# Patient Record
Sex: Female | Born: 1975 | Race: Black or African American | Hispanic: No | Marital: Married | Smoking: Current every day smoker
Health system: Southern US, Community
[De-identification: ages and names within clinical notes are randomized; demographics above are authoritative.]

## PROBLEM LIST (undated history)

## (undated) DIAGNOSIS — K219 Gastro-esophageal reflux disease without esophagitis: Secondary | ICD-10-CM

## (undated) DIAGNOSIS — E049 Nontoxic goiter, unspecified: Secondary | ICD-10-CM

## (undated) DIAGNOSIS — E119 Type 2 diabetes mellitus without complications: Secondary | ICD-10-CM

## (undated) DIAGNOSIS — D649 Anemia, unspecified: Secondary | ICD-10-CM

## (undated) DIAGNOSIS — E282 Polycystic ovarian syndrome: Secondary | ICD-10-CM

## (undated) HISTORY — DX: Anemia, unspecified: D64.9

## (undated) HISTORY — PX: CHOLECYSTECTOMY: SHX55

## (undated) HISTORY — DX: Polycystic ovarian syndrome: E28.2

## (undated) HISTORY — DX: Type 2 diabetes mellitus without complications: E11.9

## (undated) HISTORY — DX: Gastro-esophageal reflux disease without esophagitis: K21.9

---
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
## 2010-07-24 DIAGNOSIS — E282 Polycystic ovarian syndrome: Secondary | ICD-10-CM | POA: Insufficient documentation

## 2017-05-08 ENCOUNTER — Ambulatory Visit: Payer: 59 | Admitting: Medical

## 2017-05-08 ENCOUNTER — Encounter: Payer: Self-pay | Admitting: Medical

## 2017-05-08 ENCOUNTER — Telehealth: Payer: Self-pay

## 2017-05-08 VITALS — BP 110/70 | HR 86 | Temp 98.3°F | Resp 16 | Ht 65.0 in | Wt 307.2 lb

## 2017-05-08 DIAGNOSIS — E282 Polycystic ovarian syndrome: Secondary | ICD-10-CM | POA: Diagnosis not present

## 2017-05-08 DIAGNOSIS — Z862 Personal history of diseases of the blood and blood-forming organs and certain disorders involving the immune mechanism: Secondary | ICD-10-CM | POA: Diagnosis not present

## 2017-05-08 DIAGNOSIS — E118 Type 2 diabetes mellitus with unspecified complications: Secondary | ICD-10-CM | POA: Diagnosis not present

## 2017-05-08 DIAGNOSIS — N921 Excessive and frequent menstruation with irregular cycle: Secondary | ICD-10-CM

## 2017-05-08 LAB — CBC WITH DIFFERENTIAL/PLATELET
Basophils Absolute: 0 10*3/uL (ref 0.0–0.1)
Basophils Relative: 0.8 % (ref 0.0–3.0)
Eosinophils Absolute: 0.1 10*3/uL (ref 0.0–0.7)
Eosinophils Relative: 0.9 % (ref 0.0–5.0)
HCT: 35.5 % — ABNORMAL LOW (ref 36.0–46.0)
Hemoglobin: 11.5 g/dL — ABNORMAL LOW (ref 12.0–15.0)
Lymphocytes Relative: 39.6 % (ref 12.0–46.0)
Lymphs Abs: 2.5 10*3/uL (ref 0.7–4.0)
MCHC: 32.3 g/dL (ref 30.0–36.0)
MCV: 86.6 fl (ref 78.0–100.0)
Monocytes Absolute: 0.5 10*3/uL (ref 0.1–1.0)
Monocytes Relative: 8.6 % (ref 3.0–12.0)
Neutro Abs: 3.1 10*3/uL (ref 1.4–7.7)
Neutrophils Relative %: 50.1 % (ref 43.0–77.0)
Platelets: 244 10*3/uL (ref 150.0–400.0)
RBC: 4.1 Mil/uL (ref 3.87–5.11)
RDW: 15 % (ref 11.5–15.5)
WBC: 6.2 10*3/uL (ref 4.0–10.5)

## 2017-05-08 LAB — HEMOGLOBIN A1C: Hgb A1c MFr Bld: 7.9 % — ABNORMAL HIGH (ref 4.6–6.5)

## 2017-05-08 LAB — COMPREHENSIVE METABOLIC PANEL
ALT: 12 U/L (ref 0–35)
AST: 14 U/L (ref 0–37)
Albumin: 3.7 g/dL (ref 3.5–5.2)
Alkaline Phosphatase: 75 U/L (ref 39–117)
BUN: 11 mg/dL (ref 6–23)
CO2: 29 mEq/L (ref 19–32)
Calcium: 9.2 mg/dL (ref 8.4–10.5)
Chloride: 103 mEq/L (ref 96–112)
Creatinine, Ser: 0.69 mg/dL (ref 0.40–1.20)
GFR: 120.22 mL/min (ref >60.00)
Glucose, Bld: 153 mg/dL — ABNORMAL HIGH (ref 70–99)
Potassium: 4 mEq/L (ref 3.5–5.1)
Sodium: 137 mEq/L (ref 135–145)
Total Bilirubin: 0.5 mg/dL (ref 0.2–1.2)
Total Protein: 7.6 g/dL (ref 6.0–8.3)

## 2017-05-08 MED ORDER — METFORMIN HCL 500 MG PO TABS: 500.0000 mg | ORAL_TABLET | Freq: Two times a day (BID) | ORAL | 3 refills | Status: DC

## 2017-05-08 MED ORDER — LIRAGLUTIDE 18 MG/3ML ~~LOC~~ SOPN: 1.2000 mg | PEN_INJECTOR | Freq: Once | SUBCUTANEOUS | 1 refills | Status: DC

## 2017-05-08 MED ORDER — PHENTERMINE HCL 37.5 MG PO CAPS: 37.5000 mg | ORAL_CAPSULE | ORAL | 0 refills | Status: DC

## 2017-05-08 MED FILL — PHENTERMINE 37.5 MG CAPSULE: 37.5 | 30 days supply | Qty: 30 | Fill #0

## 2017-05-08 MED FILL — metFORMIN HCL 500 MG TABS: 500 | 90 days supply | Qty: 180 | Fill #0

## 2017-05-08 NOTE — Patient Instructions (Addendum)
For diabetes, I am refilling your former victoza prescription and metformin.  Encouraged healthy/low sugar diet and daily exercise.  I am going to refer you to endocrinologist as you had one before and also have PCOS.  For history of obesity, continue with daily exercise and diet.  You have been on phentermine in the past but had to stop when he lost insurance.  I am going to prescribe phentermine.  Printed prescription given today.  Need to follow-up in 1 month to recheck your weight.  Make sure no adverse side effects.  I want you to check your blood pressure and pulse at least every other day.  Need to make sure that your blood pressure does not increase over 140/90 or your pulse over 100 consistently.  Note after 2-3 months of phentermine I think it would be best to refer to weight loss specialist you desire more weight loss.  Please get CBC, CMP and A1c today.  I did go ahead and place referral to gynecologist to discuss possibility of hysterectomy in light of your story of severe heavy menses with anemia.   Follow-up in 1 month or as needed.

## 2017-05-08 NOTE — Progress Notes (Signed)
Subjective:    Patient ID: Katie Luna, female    DOB: 01/29/1976, 42 y.o.   MRN: [id number]  HPI  Pt in for first time.  Pt is from [place]. She moved here after husband job transfer. 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 Pt has been exercising. Diet has been moderate healthy.   Pt in May was trying to loose weight. She was exercising, went to nutritionist and was on phentermine. Then lost insurance.  Patient states  normal thyroid studies in the past.  Pt diabetic. Has not been on medication since august. Pt last a1-c while on medication was 7.  Today fingerstick sugar was 165 non fasting. Pt was last on victoza 1.2 mg and metformin. Pt had one day was 487 about 2 weeks ago. Since then she has been checking at work and levels have run high 100's- high 200's.  Pt also has history of gerd.  Pt is on pantoprazole. This does help.   LMP- Pt has hx of polycystic ovarian syndrome. No fibroids. Pt did have [place] that showed cyst on ovaries.   History of anemia related to heavy menses in the past.One time in past had 93 days bleeding.      Review of Systems  Constitutional: Negative for chills, fatigue and fever.  HENT: Negative for dental problem, ear discharge and ear pain.   Respiratory: Negative for cough, chest tightness, shortness of breath and wheezing.   Cardiovascular: Negative for chest pain and palpitations.  Gastrointestinal: Negative for abdominal distention, abdominal pain, constipation, diarrhea, nausea and vomiting.  Endocrine: Negative for polydipsia, polyphagia and polyuria.  Musculoskeletal: Negative for back pain.  Skin: Negative for rash.  Neurological: Negative for dizziness, seizures, speech difficulty, weakness and headaches.  Hematological: Negative for adenopathy. Does not bruise/bleed easily.  Psychiatric/Behavioral: Negative for behavioral problems, confusion, dysphoric mood and suicidal ideas. The patient is not nervous/anxious.       Some moderate stress.    Past Medical History:  Diagnosis Date  . Anemia   . Diabetes mellitus without complication (HCC)   . GERD (gastroesophageal reflux disease)   . Polycystic ovary syndrome      Social History   Socioeconomic History  . Marital status: Married    Spouse name: Not on file  . Number of children: 1  . Years of education: LPN  . Highest education level: Not on file  Social Needs  . Financial resource strain: Not on file  . Food insecurity - worry: Not on file  . Food insecurity - inability: Not on file  . Transportation needs - medical: Not on file  . Transportation needs - non-medical: Not on file  Occupational History  . Not on file  Tobacco Use  . Smoking status: Never Smoker  . Smokeless tobacco: Never Used  Substance and Sexual Activity  . Alcohol use: Yes    Comment: very rare infrequent. occaisional glass of wine.  . Drug use: No  . Sexual activity: Yes  Other Topics Concern  . Not on file  Social History Narrative  . Not on file    Past Surgical History:  Procedure Laterality Date  . CHOLECYSTECTOMY      Family History  Problem Relation Age of Onset  . Diabetes Mother   . Cancer Mother   . Sickle cell anemia Father   . Alcohol abuse Father   . Colon cancer Father   . Diabetes Maternal Grandfather     Allergies not on file  Current Outpatient Medications on File Prior to Visit  Medication Sig Dispense Refill  . liraglutide (VICTOZA) 18 MG/3ML SOPN Inject into the skin.    . metFORMIN (GLUCOPHAGE) 500 MG tablet take 1 tablet by mouth twice a day    . pantoprazole (PROTONIX) 40 MG tablet take 1 tablet by mouth once daily     No current facility-administered medications on file prior to visit.     BP 133/88   Pulse 86   Temp 98.3 F (36.8 C) (Oral)   Resp 16   Ht 5\' 5"  (1.651 m)   Wt (!) 307 lb 3.2 oz (139.3 kg)   SpO2 100%   BMI 51.12 kg/m       Objective:   Physical Exam  General Mental Status- Alert.  General Appearance- Not in acute distress.  Obese body habitus.  Skin General: Color- Normal Color. Moisture- Normal Moisture.  Neck Carotid Arteries- Normal color. Moisture- Normal Moisture. No carotid bruits. No JVD.  Chest and Lung Exam Auscultation: Breath Sounds:-Normal.  Cardiovascular Auscultation:Rythm- Regular. Murmurs & Other Heart Sounds:Auscultation of the heart reveals- No Murmurs.  Neurologic Cranial Nerve exam:- CN III-XII intact(No nystagmus), symmetric smile. Strength:- 5/5 equal and symmetric strength both upper and lower extremities.  Abdomen-soft, nontender, obese protuberant.  Back- no CVA tenderness.      Assessment & Plan:  For diabetes, I am refilling your former victoza prescription and metformin.  Encouraged healthy/low sugar diet and daily exercise.  I am going to refer you to endocrinologist as you had one before and also have PCOS.  For history of obesity, continue with daily exercise and diet.  You have been on phentermine in the past but had to stop when he lost insurance.  I am going to prescribe phentermine.  Printed prescription given today.  Need to follow-up in 1 month to recheck your weight.  Make sure no adverse side effects.  I want you to check your blood pressure and pulse at least every other day.  Need to make sure that you may not increase in your blood pressure over 140/90 or your pulse over 100 consistently.  Note after 2-3 months of phentermine I think it would be best to refer to weight loss specialist you desire more weight loss.  Please get CBC, CMP and A1c today.  I did go ahead and place referral to gynecologist to discuss possibility of hysterectomy in light of your story of severe heavy menses with anemia.   Follow-up in 1 month or as needed.  Buffy Ehler, Ramon DredgeEdward, PA-C

## 2017-05-08 NOTE — Telephone Encounter (Signed)
PA initiated via Covermymeds; KEY: [id number]. Awaiting determination.

## 2017-05-13 ENCOUNTER — Encounter: Payer: Self-pay | Admitting: Medical

## 2017-05-13 NOTE — Telephone Encounter (Signed)
PA denied. Preferred alternatives are Bydureon Bcise, Byetta, or Trulicity.

## 2017-05-14 ENCOUNTER — Telehealth: Payer: Self-pay | Admitting: Medical

## 2017-05-14 MED ORDER — EXENATIDE ER 2 MG ~~LOC~~ PEN: 2.0000 mg | PEN_INJECTOR | SUBCUTANEOUS | 2 refills | Status: DC

## 2017-05-14 NOTE — Telephone Encounter (Signed)
Would you call patient and notify her that victoza was not covered under insurance and prior authorization failed.  So I called in new medication which is once weekly injection.  It is similar to the victoza.  More convenient and that it is a once weekly injection.  However it may take numerous weeks to see full effect of the medication.

## 2017-05-15 MED FILL — BYDUREON 2 MG PEN INJECT: 2 | 28 days supply | Qty: 4 | Fill #0

## 2017-05-15 NOTE — Telephone Encounter (Signed)
Pt.notified

## 2017-05-15 NOTE — Telephone Encounter (Signed)
Left pt a message to call back. 

## 2017-05-18 DIAGNOSIS — F3132 Bipolar disorder, current episode depressed, moderate: Secondary | ICD-10-CM | POA: Diagnosis not present

## 2017-05-18 MED FILL — lamoTRIgine 25 MG TABS: 25 | 30 days supply | Qty: 90 | Fill #0

## 2017-05-19 NOTE — Telephone Encounter (Signed)
Med changed to Bydureon.

## 2017-05-28 ENCOUNTER — Ambulatory Visit: Payer: 59 | Admitting: Family Medicine

## 2017-05-28 ENCOUNTER — Encounter: Payer: 59 | Admitting: Family Medicine

## 2017-06-10 ENCOUNTER — Ambulatory Visit: Payer: 59 | Admitting: Medical

## 2017-06-15 ENCOUNTER — Encounter: Payer: Self-pay | Admitting: Medical

## 2017-06-15 ENCOUNTER — Ambulatory Visit: Payer: 59 | Admitting: Medical

## 2017-06-15 VITALS — BP 108/66 | HR 85 | Temp 97.9°F | Resp 16 | Ht 65.0 in | Wt 299.4 lb

## 2017-06-15 DIAGNOSIS — E119 Type 2 diabetes mellitus without complications: Secondary | ICD-10-CM | POA: Diagnosis not present

## 2017-06-15 MED ORDER — PHENTERMINE HCL 37.5 MG PO CAPS
37.5000 mg | ORAL_CAPSULE | ORAL | 0 refills | Status: DC
Start: 2017-06-15 — End: 2017-08-14

## 2017-06-15 MED FILL — PHENTERMINE 37.5 MG CAPSULE: 37.5 | 30 days supply | Qty: 30 | Fill #0

## 2017-06-15 MED FILL — lamoTRIgine 100 MG TABS: 100 | 30 days supply | Qty: 45 | Fill #0

## 2017-06-15 NOTE — Progress Notes (Signed)
Subjective:    Patient ID: Katie Luna, female    DOB: 16-Sep-1975, 42 y.o.   MRN: [id number]  HPI  Pt is exercising 3-4 days a week, improved diet and has been taking her medications.  Pt is on metformin, and exanatide.   She states her fasting sugars have been 102-190. Levels average usually around 140. Better with bydureon.  Pt psychiatrist wrote her lamictal. She states ate more and gained some weight.  Specialist wanted her to stay of phentermine initially while on lamictal initially. Then after 3 weeks he stated she could go back on.   Review of Systems  Constitutional: Negative for chills, fatigue and fever.  Respiratory: Negative for cough, chest tightness, shortness of breath and wheezing.   Cardiovascular: Negative for chest pain and palpitations.  Gastrointestinal: Negative for abdominal distention, abdominal pain, nausea and vomiting.  Musculoskeletal: Negative for back pain, gait problem, joint swelling, myalgias and neck stiffness.  Skin: Negative for rash.  Hematological: Negative for adenopathy. Does not bruise/bleed easily.  Psychiatric/Behavioral: Negative for behavioral problems, confusion, dysphoric mood and hallucinations.    Past Medical History:  Diagnosis Date  . Anemia   . Diabetes mellitus without complication (HCC)   . GERD (gastroesophageal reflux disease)   . Polycystic ovary syndrome      Social History   Socioeconomic History  . Marital status: Married    Spouse name: Not on file  . Number of children: 1  . Years of education: LPN  . Highest education level: Not on file  Social Needs  . Financial resource strain: Not on file  . Food insecurity - worry: Not on file  . Food insecurity - inability: Not on file  . Transportation needs - medical: Not on file  . Transportation needs - non-medical: Not on file  Occupational History  . Not on file  Tobacco Use  . Smoking status: Never Smoker  . Smokeless tobacco: Never Used  Substance  and Sexual Activity  . Alcohol use: Yes    Comment: very rare infrequent. occaisional glass of wine.  . Drug use: No  . Sexual activity: Yes  Other Topics Concern  . Not on file  Social History Narrative  . Not on file    Past Surgical History:  Procedure Laterality Date  . CHOLECYSTECTOMY      Family History  Problem Relation Age of Onset  . Diabetes Mother   . Cancer Mother   . Sickle cell anemia Father   . Alcohol abuse Father   . Colon cancer Father   . Diabetes Maternal Grandfather     Allergies  Allergen Reactions  . Other Anaphylaxis  . Shellfish Allergy Swelling    Current Outpatient Medications on File Prior to Visit  Medication Sig Dispense Refill  . Exenatide ER 2 MG PEN Inject 2 mg into the skin every 7 (seven) days. 4 each 2  . lamoTRIgine (LAMICTAL) 25 MG tablet   2  . metFORMIN (GLUCOPHAGE) 500 MG tablet take 1 tablet by mouth twice a day    . metFORMIN (GLUCOPHAGE) 500 MG tablet Take 1 tablet (500 mg total) by mouth 2 (two) times daily with a meal. 180 tablet 3  . pantoprazole (PROTONIX) 40 MG tablet take 1 tablet by mouth once daily    . phentermine 37.5 MG capsule Take 1 capsule (37.5 mg total) by mouth every morning. 30 capsule 0   No current facility-administered medications on file prior to visit.     BP 108/66  Pulse 85   Temp 97.9 F (36.6 C) (Oral)   Resp 16   Ht 5\' 5"  (1.651 m)   Wt 299 lb 6.4 oz (135.8 kg)   SpO2 99%   BMI 49.82 kg/m       Objective:   Physical Exam  General Mental Status- Alert. General Appearance- Not in acute distress.   Skin General: Color- Normal Color. Moisture- Normal Moisture.  Neck Carotid Arteries- Normal color. Moisture- Normal Moisture. No carotid bruits. No JVD.  Chest and Lung Exam Auscultation: Breath Sounds:-Normal.  Cardiovascular Auscultation:Rythm- Regular. Murmurs & Other Heart Sounds:Auscultation of the heart reveals- No Murmurs.  Abdomen Inspection:-Inspeection  Normal. Palpation/Percussion:Note:No mass. Palpation and Percussion of the abdomen reveal- Non Tender, Non Distended + BS, no rebound or guarding.    Neurologic Cranial Nerve exam:- CN III-XII intact(No nystagmus), symmetric smile. Drift Test:- No drift. Romberg Exam:- Negative.  Heal to Toe Gait exam:-Normal. Finger to Nose:- Normal/Intact Strength:- 5/5 equal and symmetric strength both upper and lower extremities.      Assessment & Plan:  Your diabetics appears to be better controlled by at home sugar level. Continue current regimen of meds. Will put future a1c, cmp and urine micro albumin urine.  For obesity continue exercise and diet. Can continue phentermine.  For bipolar mood continue lamictal and specialist wrote you.  Follow up end of April or as needed.

## 2017-06-15 NOTE — Patient Instructions (Addendum)
Your diabetics appears to be better controlled by at home sugar level. Continue current regimen of meds. Will put future a1c, cmp and urine micro albumin urine.  For obesity continue exercise and diet. Can continue phentermine.  For bipolar mood continue lamictal and specialist wrote you.  Follow up end of April or as needed.

## 2017-06-16 ENCOUNTER — Encounter: Payer: Self-pay | Admitting: Internal Medicine

## 2017-06-30 DIAGNOSIS — F3132 Bipolar disorder, current episode depressed, moderate: Secondary | ICD-10-CM | POA: Diagnosis not present

## 2017-07-10 ENCOUNTER — Encounter: Payer: Self-pay | Admitting: Medical

## 2017-07-11 ENCOUNTER — Telehealth: Payer: Self-pay | Admitting: Medical

## 2017-07-11 MED ORDER — EXENATIDE ER 2 MG ~~LOC~~ PEN: 2.0000 mg | PEN_INJECTOR | SUBCUTANEOUS | 2 refills | Status: DC

## 2017-07-11 MED ORDER — VARENICLINE TARTRATE 0.5 MG X 11 & 1 MG X 42 PO MISC: ORAL | 0 refills | Status: DC

## 2017-07-11 NOTE — Telephone Encounter (Signed)
I printed rx of chantix. The starter pack RX too long for it to be electronically sent. Will you [facility]. Or you can have her pick up prescription up front.

## 2017-07-13 MED FILL — BYDUREON 2 MG PEN INJECT: 2 | 28 days supply | Qty: 4 | Fill #0

## 2017-07-13 MED FILL — CHANTIX STARTING MONTH BOX: 0.5 MG X 11 | 28 days supply | Qty: 53 | Fill #0

## 2017-07-13 NOTE — Telephone Encounter (Signed)
Sent rx to pharmacy

## 2017-07-28 MED FILL — lamoTRIgine 25 MG TABS: 25 | 30 days supply | Qty: 90 | Fill #1

## 2017-07-28 MED FILL — lamoTRIgine 100 MG TABS: 100 | 30 days supply | Qty: 45 | Fill #1

## 2017-08-05 DIAGNOSIS — F3132 Bipolar disorder, current episode depressed, moderate: Secondary | ICD-10-CM | POA: Diagnosis not present

## 2017-08-10 DIAGNOSIS — J209 Acute bronchitis, unspecified: Secondary | ICD-10-CM | POA: Diagnosis not present

## 2017-08-10 DIAGNOSIS — R0989 Other specified symptoms and signs involving the circulatory and respiratory systems: Secondary | ICD-10-CM | POA: Diagnosis not present

## 2017-08-14 ENCOUNTER — Ambulatory Visit (HOSPITAL_BASED_OUTPATIENT_CLINIC_OR_DEPARTMENT_OTHER)
Admission: RE | Admit: 2017-08-14 | Discharge: 2017-08-14 | Disposition: A | Discharge status: Home / Self Care | Payer: 59 | Source: Ambulatory Visit | Attending: Medical | Admitting: Medical

## 2017-08-14 ENCOUNTER — Encounter: Payer: Self-pay | Admitting: Medical

## 2017-08-14 ENCOUNTER — Ambulatory Visit: Payer: 59 | Admitting: Medical

## 2017-08-14 VITALS — BP 125/78 | HR 88 | Temp 98.2°F | Resp 16 | Ht 65.0 in | Wt 298.0 lb

## 2017-08-14 DIAGNOSIS — R059 Cough, unspecified: Secondary | ICD-10-CM

## 2017-08-14 DIAGNOSIS — R05 Cough: Secondary | ICD-10-CM | POA: Diagnosis not present

## 2017-08-14 DIAGNOSIS — J301 Allergic rhinitis due to pollen: Secondary | ICD-10-CM | POA: Diagnosis not present

## 2017-08-14 DIAGNOSIS — R062 Wheezing: Secondary | ICD-10-CM

## 2017-08-14 DIAGNOSIS — Z79899 Other long term (current) drug therapy: Secondary | ICD-10-CM | POA: Diagnosis not present

## 2017-08-14 DIAGNOSIS — F17200 Nicotine dependence, unspecified, uncomplicated: Secondary | ICD-10-CM

## 2017-08-14 MED ORDER — FLUTICASONE PROPIONATE 50 MCG/ACT NA SUSP: 2.0000 | Freq: Every day | NASAL | 1 refills | Status: AC

## 2017-08-14 MED ORDER — BECLOMETHASONE DIPROP HFA 40 MCG/ACT IN AERB: 2.0000 | INHALATION_SPRAY | Freq: Two times a day (BID) | RESPIRATORY_TRACT | 2 refills | Status: DC

## 2017-08-14 MED ORDER — BENZONATATE 100 MG PO CAPS: 100.0000 mg | ORAL_CAPSULE | Freq: Three times a day (TID) | ORAL | 0 refills | Status: DC | PRN

## 2017-08-14 MED ORDER — LEVOCETIRIZINE DIHYDROCHLORIDE 5 MG PO TABS
5.0000 mg | ORAL_TABLET | Freq: Every evening | ORAL | 0 refills | Status: DC
Start: 2017-08-14 — End: 2020-01-09

## 2017-08-14 MED ORDER — PHENTERMINE HCL 37.5 MG PO CAPS: 37.5000 mg | ORAL_CAPSULE | ORAL | 0 refills | Status: DC

## 2017-08-14 MED ORDER — BUPROPION HCL ER (XL) 150 MG PO TB24: 150.0000 mg | ORAL_TABLET | Freq: Every day | ORAL | 1 refills | Status: DC

## 2017-08-14 MED FILL — LEVOCETIRIZINE 5 MG TABLET: 5 | 30 days supply | Qty: 30 | Fill #0

## 2017-08-14 MED FILL — BENZONATATE 100 MG CAP: 100 | 10 days supply | Qty: 30 | Fill #0

## 2017-08-14 MED FILL — buPROPion HCL ER (XL) 150 M: 150 | 30 days supply | Qty: 30 | Fill #0

## 2017-08-14 MED FILL — FLUTICASONE PROP 50 MCG SPR: 50 | 30 days supply | Qty: 16 | Fill #0

## 2017-08-14 MED FILL — PHENTERMINE 37.5 MG CAPSULE: 37.5 | 30 days supply | Qty: 30 | Fill #0

## 2017-08-14 MED FILL — QVAR REDIHALER 40 MCG/ACT A: 40 | 30 days supply | Qty: 11 | Fill #0

## 2017-08-14 NOTE — Patient Instructions (Addendum)
For your cough and history of recent bronchitis, I am refilling your benzonatate and getting chest x-ray.  You did finish a course of doxycycline but I want to verify that chest x-ray is clear as you are cough is now approaching 3 weeks.  Recent allergic rhinitis type signs and symptoms as well.  I prescribed Xyzal and Flonase.    For intermittent wheezing, I prescribed Qvar and you have Ventolin inhaler to use if needed.  For smoking cessation, I will prescribe Wellbutrin.  Start today and in 2 weeks start to taper off smoking.  Chantix discontinued since he lost the prescription and I think insurance probably would not refill it today.  You have lost about 10 pounds since January.  I am going to refill the phentermine but also get you sign a controlled medication contract and give a UDS.  Plan to refill phentermine for the month of June as well.  Then July consider other medication options.  Follow-up in 7-10 days or as needed

## 2017-08-14 NOTE — Progress Notes (Signed)
Subjective:    Patient ID: Katie Luna, female    DOB: [birth year], 42 y.o.   MRN: [id number]  HPI  Pt in for follow up.  Pt states 2 weeks ago she was having severe coughing event. Pt went to [facility] in [place]. She was given doxycycline, ventolin and benzonatate. She states cough was dry with sob at that time. Pt states she feels a lot better now. But still residual cough.  Pt has some pnd, itchy eyes and runny nose as well over past 2 weeks. Pt still has cough but as severe as before. Keeping hr up at night.  Pt states she has been trying to stop smoking. She took chantix for 4 days. Her daughter accidentally threw her chantix away after cleaning kitchen. Pt is smoking about 4-5 cigarretes a day.  Pt has been loosing weight. She is on phentermine. No side effects. She did loose weight. But can't exercise since will cough and feel short of breath.     Review of Systems  Constitutional: Negative for chills, fatigue and fever.  HENT: Positive for congestion, postnasal drip and sneezing. Negative for sinus pressure and sinus pain.   Respiratory: Positive for cough, shortness of breath and wheezing.        At times sob/wheezing. But not presently on exam  Cardiovascular: Negative for chest pain and palpitations.  Gastrointestinal: Negative for abdominal pain.  Genitourinary: Negative for dysuria, flank pain and frequency.  Musculoskeletal: Negative for back pain.  Neurological: Negative for dizziness, weakness, light-headedness and headaches.  Hematological: Negative for adenopathy. Does not bruise/bleed easily.  Psychiatric/Behavioral: Negative for behavioral problems and confusion.   Past Medical History:  Diagnosis Date  . Anemia   . Diabetes mellitus without complication (HCC)   . GERD (gastroesophageal reflux disease)   . Polycystic ovary syndrome      Social History   Socioeconomic History  . Marital status: Married    Spouse name: Not on file  . Number of  children: 1  . Years of education: LPN  . Highest education level: Not on file  Occupational History  . Not on file  Social Needs  . Financial resource strain: Not on file  . Food insecurity:    Worry: Not on file    Inability: Not on file  . Transportation needs:    Medical: Not on file    Non-medical: Not on file  Tobacco Use  . Smoking status: Current Every Day Smoker  . Smokeless tobacco: Never Used  Substance and Sexual Activity  . Alcohol use: Yes    Comment: very rare infrequent. occaisional glass of wine.  . Drug use: No  . Sexual activity: Yes  Lifestyle  . Physical activity:    Days per week: 3 days    Minutes per session: 60 min  . Stress: Very much  Relationships  . Social connections:    Talks on phone: Not on file    Gets together: Not on file    Attends religious service: Not on file    Active member of club or organization: Not on file    Attends meetings of clubs or organizations: Not on file    Relationship status: Not on file  . Intimate partner violence:    Fear of current or ex partner: Not on file    Emotionally abused: Not on file    Physically abused: Not on file    Forced sexual activity: Not on file  Other Topics Concern  . Not  on file  Social History Narrative  . Not on file    Past Surgical History:  Procedure Laterality Date  . CHOLECYSTECTOMY      Family History  Problem Relation Age of Onset  . Diabetes Mother   . Cancer Mother   . Sickle cell anemia Father   . Alcohol abuse Father   . Colon cancer Father   . Diabetes Maternal Grandfather     Allergies  Allergen Reactions  . Other Anaphylaxis  . Shellfish Allergy Swelling    Current Outpatient Medications on File Prior to Visit  Medication Sig Dispense Refill  . Exenatide ER 2 MG PEN Inject 2 mg into the skin every 7 (seven) days. 4 each 2  . lamoTRIgine (LAMICTAL) 100 MG tablet   5  . metFORMIN (GLUCOPHAGE) 500 MG tablet take 1 tablet by mouth twice a day    .  metFORMIN (GLUCOPHAGE) 500 MG tablet Take 1 tablet (500 mg total) by mouth 2 (two) times daily with a meal. 180 tablet 3  . pantoprazole (PROTONIX) 40 MG tablet take 1 tablet by mouth once daily    . varenicline (CHANTIX PAK) 0.5 MG X 11 & 1 MG X 42 tablet Take one 0.5 mg tablet by mouth once daily for 3 days, then increase to one 0.5 mg tablet twice daily for 4 days, then increase to one 1 mg tablet twice daily. 53 tablet 0  . VENTOLIN HFA 108 (90 Base) MCG/ACT inhaler   0   No current facility-administered medications on file prior to visit.     BP 125/78   Pulse 88   Temp 98.2 F (36.8 C) (Oral)   Resp 16   Ht 5\' 5"  (1.651 m)   Wt 298 lb (135.2 kg)   SpO2 100%   BMI 49.59 kg/m       Objective:   Physical Exam  General  Mental Status - Alert. General Appearance - Well groomed. Not in acute distress.  Skin Rashes- No Rashes.  HEENT Head- Normal. Ear Auditory Canal - Left- Normal. Right - Normal.Tympanic Membrane- Left- Normal. Right- Normal. Eye Sclera/Conjunctiva- Left- Normal. Right- Normal. Nose & Sinuses Nasal Mucosa- Left-  Boggy and Congested. Right-  Boggy and  Congested.Bilateral no  maxillary and no  frontal sinus pressure. Mouth & Throat Lips: Upper Lip- Normal: no dryness, cracking, pallor, cyanosis, or vesicular eruption. Lower Lip-Normal: no dryness, cracking, pallor, cyanosis or vesicular eruption. Buccal Mucosa- Bilateral- No Aphthous ulcers. Oropharynx- No Discharge or Erythema. +pnd. Tonsils: Characteristics- Bilateral- No Erythema or Congestion. Size/Enlargement- Bilateral- No enlargement. Discharge- bilateral-None.  Neck Neck- Supple. No Masses.   Chest and Lung Exam Auscultation: Breath Sounds:-Clear even and unlabored.  Cardiovascular Auscultation:Rythm- Regular, rate and rhythm. Murmurs & Other Heart Sounds:Ausculatation of the heart reveal- No Murmurs.  Lymphatic Head & Neck General Head & Neck Lymphatics: Bilateral: Description- No  Localized lymphadenopathy.       Assessment & Plan:  For your cough and history of recent bronchitis, I am refilling your benzonatate and getting chest x-ray.  You did finish a course of doxycycline but I want to verify that chest x-ray is clear as you are cough is now approaching 3 weeks.  Recent allergic rhinitis type signs and symptoms as well.  I prescribed Xyzal and Flonase.    For intermittent wheezing, I prescribed Qvar and you have Ventolin inhaler to use if needed.  For smoking cessation, I will prescribe Wellbutrin.  Start today and in 2 weeks start to  taper off smoking.  Chantix discontinued since he lost the prescription and I think insurance probably would not refill it today.  You have lost about 10 pounds since January.  I am going to refill the phentermine but also get you sign a controlled medication contract and give a UDS.  Plan to refill phentermine for the month of June as well.  Then July consider other medication options.  Follow-up in 7-10 days or as needed  [employer], [employer]

## 2017-08-15 LAB — PAIN MGMT, PROFILE 8 W/CONF, U
6 Acetylmorphine: NEGATIVE ng/mL (ref <10)
Alcohol Metabolites: NEGATIVE ng/mL (ref <500)
Amphetamines: NEGATIVE ng/mL (ref <500)
Benzodiazepines: NEGATIVE ng/mL (ref <100)
Buprenorphine, Urine: NEGATIVE ng/mL (ref <5)
Cocaine Metabolite: NEGATIVE ng/mL (ref <150)
Creatinine: 162 mg/dL
MDMA: NEGATIVE ng/mL (ref <500)
Marijuana Metabolite: NEGATIVE ng/mL (ref <20)
Opiates: NEGATIVE ng/mL (ref <100)
Oxidant: NEGATIVE ug/mL (ref <200)
Oxycodone: NEGATIVE ng/mL (ref <100)
pH: 6.85 (ref 4.5–9.0)

## 2017-08-17 ENCOUNTER — Ambulatory Visit: Payer: 59 | Admitting: Medical

## 2017-08-24 MED FILL — lamoTRIgine 100 MG TABS: 100 | 30 days supply | Qty: 45 | Fill #2

## 2017-08-24 MED FILL — lamoTRIgine 25 MG TABS: 25 | 30 days supply | Qty: 90 | Fill #2

## 2017-08-25 ENCOUNTER — Telehealth: Payer: Self-pay | Admitting: Medical

## 2017-08-25 ENCOUNTER — Encounter: Payer: Self-pay | Admitting: Medical

## 2017-08-25 MED ORDER — FLUCONAZOLE 150 MG PO TABS: 150.0000 mg | ORAL_TABLET | Freq: Once | ORAL | 0 refills | Status: AC

## 2017-08-25 MED FILL — FLUCONAZOLE 150 MG TABLET: 150 | 1 days supply | Qty: 1 | Fill #0

## 2017-08-25 NOTE — Telephone Encounter (Signed)
Rx diflucan written for possible thrush.

## 2017-08-28 ENCOUNTER — Other Ambulatory Visit: Payer: Self-pay

## 2017-08-28 ENCOUNTER — Ambulatory Visit: Payer: 59 | Admitting: Internal Medicine

## 2017-08-28 ENCOUNTER — Encounter: Payer: Self-pay | Admitting: Internal Medicine

## 2017-08-28 VITALS — BP 130/68 | HR 101 | Temp 98.2°F | Resp 16 | Ht 65.0 in | Wt 294.4 lb

## 2017-08-28 DIAGNOSIS — J4541 Moderate persistent asthma with (acute) exacerbation: Secondary | ICD-10-CM | POA: Diagnosis not present

## 2017-08-28 MED ORDER — ALBUTEROL SULFATE (2.5 MG/3ML) 0.083% IN NEBU: 2.5000 mg | INHALATION_SOLUTION | Freq: Four times a day (QID) | RESPIRATORY_TRACT | 1 refills | Status: AC | PRN

## 2017-08-28 MED ORDER — AZITHROMYCIN 250 MG PO TABS: ORAL_TABLET | ORAL | 0 refills | Status: DC

## 2017-08-28 MED ORDER — BUDESONIDE-FORMOTEROL FUMARATE 160-4.5 MCG/ACT IN AERO: 2.0000 | INHALATION_SPRAY | Freq: Two times a day (BID) | RESPIRATORY_TRACT | 3 refills | Status: DC

## 2017-08-28 MED ORDER — PREDNISONE 10 MG PO TABS: ORAL_TABLET | ORAL | 0 refills | Status: DC

## 2017-08-28 MED ORDER — PANTOPRAZOLE SODIUM 40 MG PO TBEC: 40.0000 mg | DELAYED_RELEASE_TABLET | Freq: Every day | ORAL | 3 refills | Status: AC

## 2017-08-28 MED FILL — ALBUTEROL 0.083% INHAL SOLN: (2.5 MG/3ML | 13 days supply | Qty: 150 | Fill #0

## 2017-08-28 MED FILL — SYMBICORT 160-4.5 MCG INH: 160-4.5 | 30 days supply | Qty: 10 | Fill #0

## 2017-08-28 MED FILL — PANTOPRAZOLE SOD DR 40 MG T: 40 | 90 days supply | Qty: 90 | Fill #0

## 2017-08-28 MED FILL — AZITHROMYCIN 250 MG TABLET: 250 | 5 days supply | Qty: 6 | Fill #0

## 2017-08-28 MED FILL — predniSONE 10 MG TABS: 10 | 5 days supply | Qty: 10 | Fill #0

## 2017-08-28 NOTE — Progress Notes (Signed)
Pre visit review using our clinic review tool, if applicable. No additional management support is needed unless otherwise documented below in the visit note. 

## 2017-08-28 NOTE — Progress Notes (Signed)
Subjective:    Patient ID: Katie Luna, female    DOB: [birth year], 42 y.o.   MRN: [id number]  DOS:  08/28/2017 Type of visit - description : acute Interval history: Was seen by Ramon Dredge 08/14/2017: At the time she was having cough, occasional wheezing for 2 weeks, she was already seen elsewhere and prescribed doxycycline, Ventolin and benzonatate. She was already feeling somewhat better but not completely well. Chest x-ray was negative. She was recommended  Qvar in addition to Ventolin. She was encouraged smoke cessation.   Review of Systems At this point, she continue with shortness of breath, cough, occasional wheezing. Using Ventolin, Qvar. When Ventolin does not help, she gets an nebulization and that seems to help better.  She went to smoke again about 2 months ago after a long period of cessation, smokes ~!/4 pack a day. Does not take Protonix, admit to acid reflux mostly at night. No sinus congestion No fever chills or chest pain. Not on birth control pills, last menstrual period   3 weeks ago.   Past Medical History:  Diagnosis Date  . Anemia   . Diabetes mellitus without complication (HCC)   . GERD (gastroesophageal reflux disease)   . Polycystic ovary syndrome     Past Surgical History:  Procedure Laterality Date  . CHOLECYSTECTOMY      Social History   Socioeconomic History  . Marital status: Married    Spouse name: Not on file  . Number of children: 1  . Years of education: LPN  . Highest education level: Not on file  Occupational History  . Not on file  Social Needs  . Financial resource strain: Not on file  . Food insecurity:    Worry: Not on file    Inability: Not on file  . Transportation needs:    Medical: Not on file    Non-medical: Not on file  Tobacco Use  . Smoking status: Current Every Day Smoker  . Smokeless tobacco: Never Used  Substance and Sexual Activity  . Alcohol use: Yes    Comment: very rare infrequent. occaisional glass  of wine.  . Drug use: No  . Sexual activity: Yes  Lifestyle  . Physical activity:    Days per week: 3 days    Minutes per session: 60 min  . Stress: Very much  Relationships  . Social connections:    Talks on phone: Not on file    Gets together: Not on file    Attends religious service: Not on file    Active member of club or organization: Not on file    Attends meetings of clubs or organizations: Not on file    Relationship status: Not on file  . Intimate partner violence:    Fear of current or ex partner: Not on file    Emotionally abused: Not on file    Physically abused: Not on file    Forced sexual activity: Not on file  Other Topics Concern  . Not on file  Social History Narrative  . Not on file      Allergies as of 08/28/2017      Reactions   Other Anaphylaxis   Shellfish Allergy Swelling      Medication List        Accurate as of 08/28/17 11:59 PM. Always use your most recent med list.          azithromycin 250 MG tablet Commonly known as:  ZITHROMAX Z-PAK 2 tabs a day the  first day, then 1 tab a day x 4 days   benzonatate 100 MG capsule Commonly known as:  TESSALON Take 1 capsule (100 mg total) by mouth 3 (three) times daily as needed for cough.   budesonide-formoterol 160-4.5 MCG/ACT inhaler Commonly known as:  SYMBICORT Inhale 2 puffs into the lungs 2 (two) times daily.   buPROPion 150 MG 24 hr tablet Commonly known as:  WELLBUTRIN XL Take 1 tablet (150 mg total) by mouth daily.   Exenatide ER 2 MG Pen Inject 2 mg into the skin every 7 (seven) days.   fluticasone 50 MCG/ACT nasal spray Commonly known as:  FLONASE Place 2 sprays into both nostrils daily.   lamoTRIgine 100 MG tablet Commonly known as:  LAMICTAL   levocetirizine 5 MG tablet Commonly known as:  XYZAL Take 1 tablet (5 mg total) by mouth every evening.   metFORMIN 500 MG tablet Commonly known as:  GLUCOPHAGE Take 1 tablet (500 mg total) by mouth 2 (two) times daily with a  meal.   pantoprazole 40 MG tablet Commonly known as:  PROTONIX Take 1 tablet (40 mg total) by mouth daily.   phentermine 37.5 MG capsule Take 1 capsule (37.5 mg total) by mouth every morning.   predniSONE 10 MG tablet Commonly known as:  DELTASONE 2 tabs a day x 5 days   VENTOLIN HFA 108 (90 Base) MCG/ACT inhaler Generic drug:  albuterol   albuterol (2.5 MG/3ML) 0.083% nebulizer solution Commonly known as:  PROVENTIL Take 3 mLs (2.5 mg total) by nebulization every 6 (six) hours as needed for wheezing or shortness of breath.          Objective:   Physical Exam BP 130/68 (BP Location: Left Arm, Patient Position: Sitting, Cuff Size: Normal)   Pulse (!) 101   Temp 98.2 F (36.8 C) (Oral)   Resp 16   Ht  (1.651 m)   Wt 294 lb 6 oz (133.5 kg)   LMP 07/29/2017   SpO2 98%   BMI 48.99 kg/m  General:   Well developed, overweight appearing.  NAD.  HEENT:  Normocephalic . Face symmetric, atraumatic.  TMs normal.  Throat symmetric, large tonsils, no red, no discharge Lungs:  Rhonchi with cough.  Slightly increased expiratory time but otherwise normal.  Frequent cough noted. Normal respiratory effort, no intercostal retractions, no accessory muscle use. Heart: RRR,  no murmur.  No pretibial edema bilaterally  Skin: Not pale. Not jaundice Neurologic:  alert & oriented X3.  Speech normal, gait appropriate for age and unassisted Psych--  Cognition and judgment appear intact.  Cooperative with normal attention span and concentration.  Behavior appropriate. No anxious or depressed appearing.      Assessment & Plan:    42 year old female, past medical history includes DM, GERD, polycystic ovarian syndrome, asthma, smoker, morbid obesity presents with   Asthma exacerbation: Her asthma is usually okay except for this time of the year, spring and summer. This exacerbation is started a month ago, status post Zithromax, chest x-ray negative. Also she started to smoke  again 2 months ago and that likely has to do with increased sxx. GERD not well controlled. Plan: Low-dose of prednisone, recommend to check CBGs and stop pred if CBGs > 200 Switch Qvar to Symbicort Use Protonix every night Mucinex DM Second round of antibiotics, send Z-Pak. We will send prescription for albuterol nebs to use as needed Strongly encouraged to stop tobacco. RTC 2 weeks to see PCP

## 2017-08-28 NOTE — Patient Instructions (Signed)
Drink plenty of fluids  Mucinex DM twice a day as needed until better  Stop Qvar and start Symbicort 2 puffs twice a day.  Take this every day regardless of the cough or wheezing  Ventilating as needed for episodes of cough  If the cough or wheezing is not responding well to ventolin use the nebulization up to 4 times a day.  Zithromax an antibiotic for 5 days  Take prednisone for 5 days, may increase your sugar, stop prednisone if it is more than 200  Come back in 2 weeks and see your primary doctor  ER if: Severe symptoms, no better with nebulizations, high fever, chest pain.

## 2017-09-11 ENCOUNTER — Encounter: Payer: Self-pay | Admitting: Podiatry

## 2017-09-11 ENCOUNTER — Telehealth: Payer: Self-pay | Admitting: *Deleted

## 2017-09-11 ENCOUNTER — Ambulatory Visit: Payer: 59 | Admitting: Podiatry

## 2017-09-11 ENCOUNTER — Encounter: Payer: Self-pay | Admitting: Medical

## 2017-09-11 ENCOUNTER — Ambulatory Visit (INDEPENDENT_AMBULATORY_CARE_PROVIDER_SITE_OTHER): Payer: 59

## 2017-09-11 ENCOUNTER — Ambulatory Visit: Payer: 59 | Admitting: Medical

## 2017-09-11 VITALS — BP 118/80 | HR 92 | Temp 98.3°F | Resp 16 | Ht 65.0 in | Wt 295.6 lb

## 2017-09-11 DIAGNOSIS — F17200 Nicotine dependence, unspecified, uncomplicated: Secondary | ICD-10-CM

## 2017-09-11 DIAGNOSIS — M7989 Other specified soft tissue disorders: Secondary | ICD-10-CM

## 2017-09-11 DIAGNOSIS — R05 Cough: Secondary | ICD-10-CM | POA: Diagnosis not present

## 2017-09-11 DIAGNOSIS — M6740 Ganglion, unspecified site: Secondary | ICD-10-CM

## 2017-09-11 DIAGNOSIS — E119 Type 2 diabetes mellitus without complications: Secondary | ICD-10-CM

## 2017-09-11 DIAGNOSIS — I739 Peripheral vascular disease, unspecified: Secondary | ICD-10-CM

## 2017-09-11 DIAGNOSIS — R059 Cough, unspecified: Secondary | ICD-10-CM

## 2017-09-11 DIAGNOSIS — F329 Major depressive disorder, single episode, unspecified: Secondary | ICD-10-CM

## 2017-09-11 DIAGNOSIS — B351 Tinea unguium: Secondary | ICD-10-CM | POA: Diagnosis not present

## 2017-09-11 DIAGNOSIS — M799 Soft tissue disorder, unspecified: Secondary | ICD-10-CM | POA: Diagnosis not present

## 2017-09-11 DIAGNOSIS — R062 Wheezing: Secondary | ICD-10-CM | POA: Diagnosis not present

## 2017-09-11 DIAGNOSIS — F32A Depression, unspecified: Secondary | ICD-10-CM

## 2017-09-11 LAB — COMPREHENSIVE METABOLIC PANEL
ALT: 12 U/L (ref 0–35)
AST: 10 U/L (ref 0–37)
Albumin: 3.5 g/dL (ref 3.5–5.2)
Alkaline Phosphatase: 77 U/L (ref 39–117)
BUN: 5 mg/dL — ABNORMAL LOW (ref 6–23)
CO2: 27 mEq/L (ref 19–32)
Calcium: 8.6 mg/dL (ref 8.4–10.5)
Chloride: 106 mEq/L (ref 96–112)
Creatinine, Ser: 0.84 mg/dL (ref 0.40–1.20)
GFR: 95.64 mL/min (ref >60.00)
Glucose, Bld: 104 mg/dL — ABNORMAL HIGH (ref 70–99)
Potassium: 3.7 mEq/L (ref 3.5–5.1)
Sodium: 138 mEq/L (ref 135–145)
Total Bilirubin: 0.3 mg/dL (ref 0.2–1.2)
Total Protein: 7.1 g/dL (ref 6.0–8.3)

## 2017-09-11 LAB — HEMOGLOBIN A1C: Hgb A1c MFr Bld: 7.1 % — ABNORMAL HIGH (ref 4.6–6.5)

## 2017-09-11 MED ORDER — HYDROCODONE-HOMATROPINE 5-1.5 MG/5ML PO SYRP: 5.0000 mL | ORAL_SOLUTION | Freq: Three times a day (TID) | ORAL | 0 refills | Status: DC | PRN

## 2017-09-11 MED FILL — HYDROCODONE-HOMATROPINE SOL: 5-1.5 | 7 days supply | Qty: 100 | Fill #0

## 2017-09-11 MED FILL — QVAR REDIHALER 40 MCG/ACT A: 40 | 30 days supply | Qty: 11 | Fill #1

## 2017-09-11 MED FILL — buPROPion HCL ER (XL) 150 M: 150 | 30 days supply | Qty: 30 | Fill #1

## 2017-09-11 MED FILL — FLUTICASONE PROP 50 MCG SPR: 50 | 30 days supply | Qty: 16 | Fill #1

## 2017-09-11 MED FILL — ALBUTEROL 0.083% INHAL SOLN: (2.5 MG/3ML | 13 days supply | Qty: 150 | Fill #1

## 2017-09-11 NOTE — Progress Notes (Addendum)
Subjective:    Patient ID: Katie Luna, female    DOB: 05-Sep-1975, 42 y.o.   MRN: [id number]  HPI 42 year old female presents the office today for concerns of thick, discolored toenails that she cannot trim herself or to cause pressure and pain inside shoes.  Denies any redness or drainage.  She said toenail fungus for several years mostly to her big toenails she is tried over-the-counter tea without any significant improvement.  She also states that she gets swelling to the also aspect of the right ankle which will occasionally cause discomfort towards the end of the day.  She said no recent treatment for this.  No recent injury.  She is diabetic her last A1c was 7.9.   Review of Systems  All other systems reviewed and are negative.  Past Medical History:  Diagnosis Date  . Anemia   . Diabetes mellitus without complication (HCC)   . GERD (gastroesophageal reflux disease)   . Polycystic ovary syndrome     Past Surgical History:  Procedure Laterality Date  . CHOLECYSTECTOMY       Current Outpatient Medications:  .  albuterol (PROVENTIL) (2.5 MG/3ML) 0.083% nebulizer solution, Take 3 mLs (2.5 mg total) by nebulization every 6 (six) hours as needed for wheezing or shortness of breath., Disp: 150 mL, Rfl: 1 .  azithromycin (ZITHROMAX Z-PAK) 250 MG tablet, 2 tabs a day the first day, then 1 tab a day x 4 days, Disp: 6 tablet, Rfl: 0 .  benzonatate (TESSALON) 100 MG capsule, Take 1 capsule (100 mg total) by mouth 3 (three) times daily as needed for cough., Disp: 30 capsule, Rfl: 0 .  budesonide-formoterol (SYMBICORT) 160-4.5 MCG/ACT inhaler, Inhale 2 puffs into the lungs 2 (two) times daily., Disp: 1 Inhaler, Rfl: 3 .  buPROPion (WELLBUTRIN XL) 150 MG 24 hr tablet, Take 1 tablet (150 mg total) by mouth daily., Disp: 30 tablet, Rfl: 1 .  Exenatide ER 2 MG PEN, Inject 2 mg into the skin every 7 (seven) days., Disp: 4 each, Rfl: 2 .  fluticasone (FLONASE) 50 MCG/ACT nasal spray, Place 2  sprays into both nostrils daily., Disp: 16 g, Rfl: 1 .  HYDROcodone-homatropine (HYCODAN) 5-1.5 MG/5ML syrup, Take 5 mLs by mouth every 8 (eight) hours as needed for cough., Disp: 100 mL, Rfl: 0 .  lamoTRIgine (LAMICTAL) 100 MG tablet, , Disp: , Rfl: 5 .  levocetirizine (XYZAL) 5 MG tablet, Take 1 tablet (5 mg total) by mouth every evening., Disp: 30 tablet, Rfl: 0 .  metFORMIN (GLUCOPHAGE) 500 MG tablet, Take 1 tablet (500 mg total) by mouth 2 (two) times daily with a meal., Disp: 180 tablet, Rfl: 3 .  pantoprazole (PROTONIX) 40 MG tablet, Take 1 tablet (40 mg total) by mouth daily., Disp: 30 tablet, Rfl: 3 .  phentermine 37.5 MG capsule, Take 1 capsule (37.5 mg total) by mouth every morning., Disp: 30 capsule, Rfl: 0 .  VENTOLIN HFA 108 (90 Base) MCG/ACT inhaler, , Disp: , Rfl: 0  Allergies  Allergen Reactions  . Other Anaphylaxis  . Shellfish Allergy Swelling         Objective:   Physical Exam General: AAO x3, NAD  Dermatological: Nails are hypertrophic, dystrophic, discolored with ill-defined discoloration.  No surrounding redness or drainage to the toenail sites.  No other lesions identified today.  Vascular: DP pulses 2/4, PT pulses 1/4 but this could be due to some mild swelling to the ankles.  CRT less than 3 seconds.  Denies any  claudication symptoms.  Neruologic: Grossly intact via light touch bilateral.Protective threshold with Semmes Wienstein monofilament intact to all pedal sites bilateral.   Musculoskeletal: On the lateral aspect of the ankle no sinus tarsi appears to be a soft tissue mass which is likely a lipoma versus ganglion cyst.  No erythema or increase in warmth.  Also small area in the left side but not causing any pain.  There is no other areas of tenderness identified at this time.  Muscular strength 5/5 in all groups tested bilateral. Flatfoot is present and upon evaluation of her shoes she is pronating quite a bit.   Gait: Unassisted, Nonantalgic.       Assessment & Plan:  42 year old female with symptomatic onychomycosis, soft tissue mass right lateral ankle -Treatment options discussed including all alternatives, risks, and complications -Etiology of symptoms were discussed -X-rays were obtained and reviewed with the patient.  No evidence of acute fracture or stress fracture.  No calcifications are present on the area of the soft tissue mass. -Nails were debrided to the any complications or bleeding.  I sent these for culture to evaluate for fungus. -In regards to the swelling to the lateral ankle x-rays are negative.  She wants to have a mass removed if present.  I ordered an ultrasound of the area. -ABI was done in the office and this was normal.  Left side was 1.25 in the right was 1.13. -Will check orthotic coverage due to flatfeet.   Vivi Barrack DPM

## 2017-09-11 NOTE — Progress Notes (Signed)
Subjective:    Patient ID: Katie Luna, female    DOB: 04-08-1976, 42 y.o.   MRN: [id number]  HPI  Pt in for follow up.  Pt states she has drastically cut back on cigarettes. I rx'd wellbutrin. She is smoking about 2 cigarettes a day. Was smoking about pack every other day.  Pt states overall feels better with wellbutrin.  Pt has some bipolar. She states wellbutrin helped.(no hx of seizures)  Pt had a1c in January. She is due for a1c.  Pt is getting some exercise. She is compliant on meds.(sugar recently 155-180)  Pt notes states dry cough for 2 months. Pt has cough despite use of doxy, xyazal, flonase, qvar, symbicort and ventolin. Pt states at word. One time at work had to use neb machine to help wheezing. Sometimes will get into coughing fits. Cxr. Pt has neb machine at home.  Cough is severe causing dry heave.(smoker) Denies any obvious heart burn recently but she is taking protonix.    Review of Systems  Constitutional: Negative for chills, fatigue and fever.  HENT: Negative for congestion.   Respiratory: Positive for cough and wheezing. Negative for chest tightness and shortness of breath.        Last wheeze episode was Wednesday.  Cardiovascular: Negative for chest pain and palpitations.  Gastrointestinal: Negative for abdominal pain and blood in stool.  Endocrine: Negative for polydipsia, polyphagia and polyuria.  Musculoskeletal: Negative for back pain and joint swelling.  Skin: Negative for rash.  Neurological: Negative for dizziness, speech difficulty, weakness and headaches.  Hematological: Negative for adenopathy. Does not bruise/bleed easily.  Psychiatric/Behavioral: Negative for behavioral problems and confusion.   Past Medical History:  Diagnosis Date  . Anemia   . Diabetes mellitus without complication (HCC)   . GERD (gastroesophageal reflux disease)   . Polycystic ovary syndrome      Social History   Socioeconomic History  . Marital status:  Married    Spouse name: Not on file  . Number of children: 1  . Years of education: LPN  . Highest education level: Not on file  Occupational History  . Not on file  Social Needs  . Financial resource strain: Not on file  . Food insecurity:    Worry: Not on file    Inability: Not on file  . Transportation needs:    Medical: Not on file    Non-medical: Not on file  Tobacco Use  . Smoking status: Current Every Day Smoker  . Smokeless tobacco: Never Used  Substance and Sexual Activity  . Alcohol use: Yes    Comment: very rare infrequent. occaisional glass of wine.  . Drug use: No  . Sexual activity: Yes  Lifestyle  . Physical activity:    Days per week: 3 days    Minutes per session: 60 min  . Stress: Very much  Relationships  . Social connections:    Talks on phone: Not on file    Gets together: Not on file    Attends religious service: Not on file    Active member of club or organization: Not on file    Attends meetings of clubs or organizations: Not on file    Relationship status: Not on file  . Intimate partner violence:    Fear of current or ex partner: Not on file    Emotionally abused: Not on file    Physically abused: Not on file    Forced sexual activity: Not on file  Other  Topics Concern  . Not on file  Social History Narrative  . Not on file    Past Surgical History:  Procedure Laterality Date  . CHOLECYSTECTOMY      Family History  Problem Relation Age of Onset  . Diabetes Mother   . Cancer Mother   . Sickle cell anemia Father   . Alcohol abuse Father   . Colon cancer Father   . Diabetes Maternal Grandfather     Allergies  Allergen Reactions  . Other Anaphylaxis  . Shellfish Allergy Swelling    Current Outpatient Medications on File Prior to Visit  Medication Sig Dispense Refill  . albuterol (PROVENTIL) (2.5 MG/3ML) 0.083% nebulizer solution Take 3 mLs (2.5 mg total) by nebulization every 6 (six) hours as needed for wheezing or shortness  of breath. 150 mL 1  . azithromycin (ZITHROMAX Z-PAK) 250 MG tablet 2 tabs a day the first day, then 1 tab a day x 4 days 6 tablet 0  . benzonatate (TESSALON) 100 MG capsule Take 1 capsule (100 mg total) by mouth 3 (three) times daily as needed for cough. 30 capsule 0  . budesonide-formoterol (SYMBICORT) 160-4.5 MCG/ACT inhaler Inhale 2 puffs into the lungs 2 (two) times daily. 1 Inhaler 3  . buPROPion (WELLBUTRIN XL) 150 MG 24 hr tablet Take 1 tablet (150 mg total) by mouth daily. 30 tablet 1  . Exenatide ER 2 MG PEN Inject 2 mg into the skin every 7 (seven) days. 4 each 2  . fluticasone (FLONASE) 50 MCG/ACT nasal spray Place 2 sprays into both nostrils daily. 16 g 1  . lamoTRIgine (LAMICTAL) 100 MG tablet   5  . levocetirizine (XYZAL) 5 MG tablet Take 1 tablet (5 mg total) by mouth every evening. 30 tablet 0  . metFORMIN (GLUCOPHAGE) 500 MG tablet Take 1 tablet (500 mg total) by mouth 2 (two) times daily with a meal. 180 tablet 3  . pantoprazole (PROTONIX) 40 MG tablet Take 1 tablet (40 mg total) by mouth daily. 30 tablet 3  . phentermine 37.5 MG capsule Take 1 capsule (37.5 mg total) by mouth every morning. 30 capsule 0  . VENTOLIN HFA 108 (90 Base) MCG/ACT inhaler   0   No current facility-administered medications on file prior to visit.     BP 118/80   Pulse 92   Temp 98.3 F (36.8 C) (Oral)   Resp 16   Ht  (1.651 m)   Wt 295 lb 9.6 oz (134.1 kg)   SpO2 100%   BMI 49.19 kg/m       Objective:   Physical Exam  General  Mental Status - Alert. General Appearance - Well groomed. Not in acute distress.  Skin Rashes- No Rashes.  HEENT Head- Normal. Ear Auditory Canal - Left- Normal. Right - Normal.Tympanic Membrane- Left- Normal. Right- Normal. Eye Sclera/Conjunctiva- Left- Normal. Right- Normal. Nose & Sinuses Nasal Mucosa- Left-  Boggy and Congested. Right-  Boggy and  Congested.Bilateral no  maxillary and  No frontal sinus pressure. Mouth & Throat Lips: Upper Lip-  Normal: no dryness, cracking, pallor, cyanosis, or vesicular eruption. Lower Lip-Normal: no dryness, cracking, pallor, cyanosis or vesicular eruption. Buccal Mucosa- Bilateral- No Aphthous ulcers. Oropharynx- No Discharge or Erythema. Tonsils: Characteristics- Bilateral- No Erythema or Congestion. Size/Enlargement- Bilateral- No enlargement. Discharge- bilateral-None.  Neck Neck- Supple. No Masses.   Chest and Lung Exam Auscultation: Breath Sounds:-Clear even and unlabored.  Cardiovascular Auscultation:Rythm- Regular, rate and rhythm. Murmurs & Other Heart Sounds:Ausculatation of the  heart reveal- No Murmurs.  Lymphatic Head & Neck General Head & Neck Lymphatics: Bilateral: Description- No Localized lymphadenopathy.       Assessment & Plan:  For your intermittent moderate to severe cough over the past 2 months, I am prescribing you Hycodan today.  Continue with your inhalers as previously prescribed.  If you get into recurrent wheezing episode then use your neb machine.  Currently I do not think you need oral prednisone but if your wheezing worsens then we may have to prescribe you that.  Presently holding off in light of the fact that you have diabetes and your sugars were not controlled on last A1c.  Also in light of your 55-month course of dry cough, I am referring you to a pulmonologist.   For diabetes, continue current medications I will get labs today.  Check your 53-month sugar average and make med changes if necessary.  Continue your smoking cessation.  Glad to hear that you have significantly cut back.  Also glad to hear that your mood is improved with the Wellbutrin.  If any severe signs or symptoms change after hours then recommend ED evaluation.  Follow-up next Wednesday for your wellness exam.  Esperanza Richters, PA-C

## 2017-09-11 NOTE — Telephone Encounter (Signed)
-----   Message from Vivi Barrack, DPM sent at 09/11/2017 11:28 AM EDT ----- Can you order an ultrasound of the right ankle to look at soft tissue mass.   Dawn- can you please check orthotic coverage for her? She has flatfeet, plantar fasciitis and is diabetic

## 2017-09-11 NOTE — Telephone Encounter (Signed)
Faxed orders to [facility] - main scheduling.

## 2017-09-11 NOTE — Patient Instructions (Addendum)
For your intermittent moderate to severe cough over the past 2 months, I am prescribing you Hycodan today.  Continue with your inhalers as previously prescribed.  If you get into recurrent wheezing episode then use your neb machine.  Currently I do not think you need oral prednisone but if your wheezing worsens then we may have to prescribe you that.  Presently holding off in light of the fact that you have diabetes and your sugars were not controlled on last A1c.  Also in light of your 72-month course of dry cough, I am referring you to a pulmonologist.   For diabetes, continue current medications I will get labs today.  Check your 68-month sugar average and make med changes if necessary.  Continue your smoking cessation.  Glad to hear that you have significantly cut back.  Also glad to hear that your mood is improved with the Wellbutrin.  If any severe signs or symptoms change after hours then recommend ED evaluation.  Follow-up next Wednesday for your wellness exam.

## 2017-09-16 ENCOUNTER — Encounter: Payer: Self-pay | Admitting: Medical

## 2017-09-16 ENCOUNTER — Ambulatory Visit (INDEPENDENT_AMBULATORY_CARE_PROVIDER_SITE_OTHER): Payer: 59 | Admitting: Medical

## 2017-09-16 VITALS — BP 127/51 | HR 97 | Temp 98.5°F | Resp 16 | Ht 65.0 in | Wt 298.4 lb

## 2017-09-16 DIAGNOSIS — Z0000 Encounter for general adult medical examination without abnormal findings: Secondary | ICD-10-CM | POA: Diagnosis not present

## 2017-09-16 DIAGNOSIS — Z113 Encounter for screening for infections with a predominantly sexual mode of transmission: Secondary | ICD-10-CM

## 2017-09-16 DIAGNOSIS — Z23 Encounter for immunization: Secondary | ICD-10-CM | POA: Diagnosis not present

## 2017-09-16 DIAGNOSIS — E119 Type 2 diabetes mellitus without complications: Secondary | ICD-10-CM

## 2017-09-16 DIAGNOSIS — Z0100 Encounter for examination of eyes and vision without abnormal findings: Secondary | ICD-10-CM | POA: Diagnosis not present

## 2017-09-16 MED FILL — lamoTRIgine 200 MG TABS: 200 | 30 days supply | Qty: 30 | Fill #0

## 2017-09-16 NOTE — Progress Notes (Signed)
Subjective:    Patient ID: Katie Luna, female    DOB: [birth year], 42 y.o.   MRN: [id number]  HPI  Pt in for CPE.  Pt was exercising a lot recently before she got moderate cough(see last visit). She has pulmonologist appointment upcoming. Eating healthier. Was loosing weight before she could not exercise.  Pt had not had pneumonia vaccine.  Pt ok with getting tdap. Hiv screen due.     Review of Systems  Constitutional: Negative for chills, fatigue and fever.  HENT: Negative for congestion, facial swelling, hearing loss, postnasal drip, sinus pressure and sinus pain.   Respiratory: Positive for cough. Negative for chest tightness, shortness of breath and wheezing.   Cardiovascular: Negative for palpitations.  Gastrointestinal: Negative for abdominal pain.  Genitourinary: Negative for decreased urine volume, difficulty urinating, dyspareunia, dysuria and vaginal pain.  Musculoskeletal: Negative for back pain and gait problem.  Neurological: Negative for dizziness, speech difficulty, weakness, light-headedness and headaches.  Hematological: Negative for adenopathy. Does not bruise/bleed easily.  Psychiatric/Behavioral: Negative for behavioral problems and confusion.   Past Medical History:  Diagnosis Date  . Anemia   . Diabetes mellitus without complication (HCC)   . GERD (gastroesophageal reflux disease)   . Polycystic ovary syndrome      Social History   Socioeconomic History  . Marital status: Married    Spouse name: Not on file  . Number of children: 1  . Years of education: LPN  . Highest education level: Not on file  Occupational History  . Not on file  Social Needs  . Financial resource strain: Not on file  . Food insecurity:    Worry: Not on file    Inability: Not on file  . Transportation needs:    Medical: Not on file    Non-medical: Not on file  Tobacco Use  . Smoking status: Current Every Day Smoker  . Smokeless tobacco: Never Used  Substance  and Sexual Activity  . Alcohol use: Yes    Comment: very rare infrequent. occaisional glass of wine.  . Drug use: No  . Sexual activity: Yes  Lifestyle  . Physical activity:    Days per week: 3 days    Minutes per session: 60 min  . Stress: Very much  Relationships  . Social connections:    Talks on phone: Not on file    Gets together: Not on file    Attends religious service: Not on file    Active member of club or organization: Not on file    Attends meetings of clubs or organizations: Not on file    Relationship status: Not on file  . Intimate partner violence:    Fear of current or ex partner: Not on file    Emotionally abused: Not on file    Physically abused: Not on file    Forced sexual activity: Not on file  Other Topics Concern  . Not on file  Social History Narrative  . Not on file    Past Surgical History:  Procedure Laterality Date  . CHOLECYSTECTOMY      Family History  Problem Relation Age of Onset  . Diabetes Mother   . Cancer Mother   . Sickle cell anemia Father   . Alcohol abuse Father   . Colon cancer Father   . Diabetes Maternal Grandfather     Allergies  Allergen Reactions  . Other Anaphylaxis  . Shellfish Allergy Swelling    Current Outpatient Medications on File Prior  to Visit  Medication Sig Dispense Refill  . albuterol (PROVENTIL) (2.5 MG/3ML) 0.083% nebulizer solution Take 3 mLs (2.5 mg total) by nebulization every 6 (six) hours as needed for wheezing or shortness of breath. 150 mL 1  . azithromycin (ZITHROMAX Z-PAK) 250 MG tablet 2 tabs a day the first day, then 1 tab a day x 4 days 6 tablet 0  . benzonatate (TESSALON) 100 MG capsule Take 1 capsule (100 mg total) by mouth 3 (three) times daily as needed for cough. 30 capsule 0  . budesonide-formoterol (SYMBICORT) 160-4.5 MCG/ACT inhaler Inhale 2 puffs into the lungs 2 (two) times daily. 1 Inhaler 3  . buPROPion (WELLBUTRIN XL) 150 MG 24 hr tablet Take 1 tablet (150 mg total) by mouth  daily. 30 tablet 1  . Exenatide ER 2 MG PEN Inject 2 mg into the skin every 7 (seven) days. 4 each 2  . fluticasone (FLONASE) 50 MCG/ACT nasal spray Place 2 sprays into both nostrils daily. 16 g 1  . HYDROcodone-homatropine (HYCODAN) 5-1.5 MG/5ML syrup Take 5 mLs by mouth every 8 (eight) hours as needed for cough. 100 mL 0  . lamoTRIgine (LAMICTAL) 100 MG tablet   5  . levocetirizine (XYZAL) 5 MG tablet Take 1 tablet (5 mg total) by mouth every evening. 30 tablet 0  . metFORMIN (GLUCOPHAGE) 500 MG tablet Take 1 tablet (500 mg total) by mouth 2 (two) times daily with a meal. 180 tablet 3  . pantoprazole (PROTONIX) 40 MG tablet Take 1 tablet (40 mg total) by mouth daily. 30 tablet 3  . phentermine 37.5 MG capsule Take 1 capsule (37.5 mg total) by mouth every morning. 30 capsule 0  . VENTOLIN HFA 108 (90 Base) MCG/ACT inhaler   0   No current facility-administered medications on file prior to visit.     BP (!) 127/51   Pulse 97   Temp 98.5 F (36.9 C) (Oral)   Resp 16   Ht  (1.651 m)   Wt 298 lb 6.4 oz (135.4 kg)   SpO2 99%   BMI 49.66 kg/m       Objective:   Physical Exam   General Mental Status- Alert. General Appearance- Not in acute distress.   Skin General: Color- Normal Color. Moisture- Normal Moisture.  Neck Carotid Arteries- Normal color. Moisture- Normal Moisture. No carotid bruits. No JVD.  Chest and Lung Exam Auscultation: Breath Sounds:-Normal.  Cardiovascular Auscultation:Rythm- Regular. Murmurs & Other Heart Sounds:Auscultation of the heart reveals- No Murmurs.  Abdomen Inspection:-Inspeection Normal. Palpation/Percussion:Note:No mass. Palpation and Percussion of the abdomen reveal- Non Tender, Non Distended + BS, no rebound or guarding.    Neurologic Cranial Nerve exam:- CN III-XII intact(No nystagmus), symmetric smile. Strength:- 5/5 equal and symmetric strength both upper and lower extremities.      Assessment & Plan:  For you wellness  exam today I have ordered cbc, cmp, lipid panel, ua and hiv.(future labs)  Vaccine given today tdap and psv 23.  Recommend exercise and healthy diet.  We will let you know lab results as they come in.  Follow up date appointment will be determined after lab review.   Esperanza Richters, PA-C

## 2017-09-16 NOTE — Patient Instructions (Addendum)
For you wellness exam today I have ordered cbc, cmp, lipid panel, ua and hiv.(future labs)  Vaccine given today tdap and psv 23  Recommend exercise and healthy diet.  We will let you know lab results as they come in.  Follow up date appointment will be determined after lab review.    Preventive Care 40-64 Years, Female Preventive care refers to lifestyle choices and visits with your health care provider that can promote health and wellness. What does preventive care include?  A yearly physical exam. This is also called an annual well check.  Dental exams once or twice a year.  Routine eye exams. Ask your health care provider how often you should have your eyes checked.  Personal lifestyle choices, including: ? Daily care of your teeth and gums. ? Regular physical activity. ? Eating a healthy diet. ? Avoiding tobacco and drug use. ? Limiting alcohol use. ? Practicing safe sex. ? Taking low-dose aspirin daily starting at age 50. ? Taking vitamin and mineral supplements as recommended by your health care provider. What happens during an annual well check? The services and screenings done by your health care provider during your annual well check will depend on your age, overall health, lifestyle risk factors, and family history of disease. Counseling Your health care provider may ask you questions about your:  Alcohol use.  Tobacco use.  Drug use.  Emotional well-being.  Home and relationship well-being.  Sexual activity.  Eating habits.  Work and work environment.  Method of birth control.  Menstrual cycle.  Pregnancy history.  Screening You may have the following tests or measurements:  Height, weight, and BMI.  Blood pressure.  Lipid and cholesterol levels. These may be checked every 5 years, or more frequently if you are over 50 years old.  Skin check.  Lung cancer screening. You may have this screening every year starting at age 55 if you have a  30-pack-year history of smoking and currently smoke or have quit within the past 15 years.  Fecal occult blood test (FOBT) of the stool. You may have this test every year starting at age 50.  Flexible sigmoidoscopy or colonoscopy. You may have a sigmoidoscopy every 5 years or a colonoscopy every 10 years starting at age 50.  Hepatitis C blood test.  Hepatitis B blood test.  Sexually transmitted disease (STD) testing.  Diabetes screening. This is done by checking your blood sugar (glucose) after you have not eaten for a while (fasting). You may have this done every 1-3 years.  Mammogram. This may be done every 1-2 years. Talk to your health care provider about when you should start having regular mammograms. This may depend on whether you have a family history of breast cancer.  BRCA-related cancer screening. This may be done if you have a family history of breast, ovarian, tubal, or peritoneal cancers.  Pelvic exam and Pap test. This may be done every 3 years starting at age 21. Starting at age 30, this may be done every 5 years if you have a Pap test in combination with an HPV test.  Bone density scan. This is done to screen for osteoporosis. You may have this scan if you are at high risk for osteoporosis.  Discuss your test results, treatment options, and if necessary, the need for more tests with your health care provider. Vaccines Your health care provider may recommend certain vaccines, such as:  Influenza vaccine. This is recommended every year.  Tetanus, diphtheria, and acellular pertussis (  Tdap, Td) vaccine. You may need a Td booster every 10 years.  Varicella vaccine. You may need this if you have not been vaccinated.  Zoster vaccine. You may need this after age 60.  Measles, mumps, and rubella (MMR) vaccine. You may need at least one dose of MMR if you were born in 1957 or later. You may also need a second dose.  Pneumococcal 13-valent conjugate (PCV13) vaccine. You may  need this if you have certain conditions and were not previously vaccinated.  Pneumococcal polysaccharide (PPSV23) vaccine. You may need one or two doses if you smoke cigarettes or if you have certain conditions.  Meningococcal vaccine. You may need this if you have certain conditions.  Hepatitis A vaccine. You may need this if you have certain conditions or if you travel or work in places where you may be exposed to hepatitis A.  Hepatitis B vaccine. You may need this if you have certain conditions or if you travel or work in places where you may be exposed to hepatitis B.  Haemophilus influenzae type b (Hib) vaccine. You may need this if you have certain conditions.  Talk to your health care provider about which screenings and vaccines you need and how often you need them. This information is not intended to replace advice given to you by your health care provider. Make sure you discuss any questions you have with your health care provider. Document Released: 05/04/2015 Document Revised: 12/26/2015 Document Reviewed: 02/06/2015 Elsevier Interactive Patient Education  2018 [employer] Inc[employer]  

## 2017-09-17 ENCOUNTER — Telehealth: Payer: Self-pay | Admitting: *Deleted

## 2017-09-17 ENCOUNTER — Other Ambulatory Visit (INDEPENDENT_AMBULATORY_CARE_PROVIDER_SITE_OTHER): Payer: 59

## 2017-09-17 DIAGNOSIS — Z0000 Encounter for general adult medical examination without abnormal findings: Secondary | ICD-10-CM

## 2017-09-17 DIAGNOSIS — Z113 Encounter for screening for infections with a predominantly sexual mode of transmission: Secondary | ICD-10-CM

## 2017-09-17 LAB — LIPID PANEL
Cholesterol: 129 mg/dL (ref 0–200)
HDL: 33.2 mg/dL — ABNORMAL LOW (ref >39.00)
LDL Cholesterol: 81 mg/dL (ref 0–99)
NonHDL: 96.14
Total CHOL/HDL Ratio: 4
Triglycerides: 77 mg/dL (ref 0.0–149.0)
VLDL: 15.4 mg/dL (ref 0.0–40.0)

## 2017-09-17 LAB — CBC WITH DIFFERENTIAL/PLATELET
Basophils Absolute: 0 10*3/uL (ref 0.0–0.1)
Basophils Relative: 0.8 % (ref 0.0–3.0)
Eosinophils Absolute: 0.3 10*3/uL (ref 0.0–0.7)
Eosinophils Relative: 4.3 % (ref 0.0–5.0)
HCT: 31.2 % — ABNORMAL LOW (ref 36.0–46.0)
Hemoglobin: 10.1 g/dL — ABNORMAL LOW (ref 12.0–15.0)
Lymphocytes Relative: 36 % (ref 12.0–46.0)
Lymphs Abs: 2.2 10*3/uL (ref 0.7–4.0)
MCHC: 32.3 g/dL (ref 30.0–36.0)
MCV: 84.8 fl (ref 78.0–100.0)
Monocytes Absolute: 0.5 10*3/uL (ref 0.1–1.0)
Monocytes Relative: 8.9 % (ref 3.0–12.0)
Neutro Abs: 3.1 10*3/uL (ref 1.4–7.7)
Neutrophils Relative %: 50 % (ref 43.0–77.0)
Platelets: 269 10*3/uL (ref 150.0–400.0)
RBC: 3.68 Mil/uL — ABNORMAL LOW (ref 3.87–5.11)
RDW: 16.3 % — ABNORMAL HIGH (ref 11.5–15.5)
WBC: 6.1 10*3/uL (ref 4.0–10.5)

## 2017-09-17 LAB — URINALYSIS, ROUTINE W REFLEX MICROSCOPIC
Bilirubin Urine: NEGATIVE
Ketones, ur: NEGATIVE
Nitrite: NEGATIVE
Specific Gravity, Urine: >=1.03 — AB (ref 1.000–1.030)
Total Protein, Urine: NEGATIVE
Urine Glucose: NEGATIVE
Urobilinogen, UA: 0.2 (ref 0.0–1.0)
pH: 6 (ref 5.0–8.0)

## 2017-09-17 LAB — COMPREHENSIVE METABOLIC PANEL
ALT: 9 U/L (ref 0–35)
AST: 7 U/L (ref 0–37)
Albumin: 3.5 g/dL (ref 3.5–5.2)
Alkaline Phosphatase: 69 U/L (ref 39–117)
BUN: 12 mg/dL (ref 6–23)
CO2: 27 mEq/L (ref 19–32)
Calcium: 8.6 mg/dL (ref 8.4–10.5)
Chloride: 105 mEq/L (ref 96–112)
Creatinine, Ser: 0.81 mg/dL (ref 0.40–1.20)
GFR: 99.73 mL/min (ref >60.00)
Glucose, Bld: 136 mg/dL — ABNORMAL HIGH (ref 70–99)
Potassium: 4.3 mEq/L (ref 3.5–5.1)
Sodium: 138 mEq/L (ref 135–145)
Total Bilirubin: 0.3 mg/dL (ref 0.2–1.2)
Total Protein: 6.8 g/dL (ref 6.0–8.3)

## 2017-09-17 NOTE — Telephone Encounter (Signed)
Left message requesting call back with [facility] location for the [place].

## 2017-09-17 NOTE — Telephone Encounter (Signed)
-----   Message from Vivi Barrack, DPM sent at 09/16/2017  4:01 PM EDT ----- Can you please order an ultrasound of the right ankle to evaluate soft tissue mass?  Thank you

## 2017-09-18 ENCOUNTER — Ambulatory Visit (HOSPITAL_BASED_OUTPATIENT_CLINIC_OR_DEPARTMENT_OTHER)
Admission: RE | Admit: 2017-09-18 | Discharge: 2017-09-18 | Disposition: A | Discharge status: Home / Self Care | Payer: 59 | Source: Ambulatory Visit | Attending: Medical | Admitting: Medical

## 2017-09-18 ENCOUNTER — Ambulatory Visit: Payer: 59 | Admitting: Medical

## 2017-09-18 ENCOUNTER — Telehealth: Payer: Self-pay | Admitting: Podiatry

## 2017-09-18 ENCOUNTER — Encounter: Payer: Self-pay | Admitting: Medical

## 2017-09-18 VITALS — BP 110/70 | HR 107 | Temp 98.5°F | Resp 16 | Ht 65.0 in | Wt 297.8 lb

## 2017-09-18 DIAGNOSIS — R918 Other nonspecific abnormal finding of lung field: Secondary | ICD-10-CM | POA: Insufficient documentation

## 2017-09-18 DIAGNOSIS — R062 Wheezing: Secondary | ICD-10-CM | POA: Diagnosis not present

## 2017-09-18 DIAGNOSIS — J45901 Unspecified asthma with (acute) exacerbation: Secondary | ICD-10-CM | POA: Diagnosis not present

## 2017-09-18 DIAGNOSIS — R05 Cough: Secondary | ICD-10-CM | POA: Diagnosis not present

## 2017-09-18 DIAGNOSIS — R059 Cough, unspecified: Secondary | ICD-10-CM

## 2017-09-18 LAB — HIV ANTIBODY (ROUTINE TESTING W REFLEX): HIV 1&2 Ab, 4th Generation: NONREACTIVE

## 2017-09-18 MED ORDER — PREDNISONE 10 MG PO TABS: ORAL_TABLET | ORAL | 0 refills | Status: DC

## 2017-09-18 MED ORDER — HYDROCODONE-HOMATROPINE 5-1.5 MG/5ML PO SYRP: 5.0000 mL | ORAL_SOLUTION | Freq: Three times a day (TID) | ORAL | 0 refills | Status: DC | PRN

## 2017-09-18 MED FILL — predniSONE 10 MG TABS: 10 | 5 days supply | Qty: 15 | Fill #0

## 2017-09-18 NOTE — Patient Instructions (Signed)
For your recurrent severe cough and wheezing, I do want you to get a chest x-ray today.  After review of x-ray might decide on prescribing another antibiotic.  I decided to go ahead and prescribe you 5-day taper dose of prednisone.  Also prescribing Hycodan again.  I think you will be able to fill that tomorrow or on Sunday..  Continue your allergy medications and your inhalers.  I want you to check your blood sugars fasting and before meals.  Continue by bydureon and metformin.  Unfortunately with a recent severe cough and wheezing as we approached the weekend feel it is necessary to give you the prednisone.  If your blood sugars are above 200 pre-meal then use a Humalog sliding scale that I gave.  If you feel weak or dizzy over the weekend would also recommend checking your sugars.  Work note given to return on Monday. Keep appointment with pulmonologist in July.  Follow-up with me in 7 days or as needed

## 2017-09-18 NOTE — Progress Notes (Addendum)
Subjective:    Patient ID: Katie Luna, female    DOB: Sep 13, 1975, 42 y.o.   MRN: [id number]  HPI  Pt in with onset of cough this morning. Coughing since 2 am. Wheezing intermittent all day.  Pt taken 2 neb treatment today. Last treatment was at 1 pm. Gave her relief for about 2 hours.  Pt is seeing pulmonologist July 5,2019.   She has been having on and off cough for 2 months. Pt has cough despite use of doxy, xyazal, flonase, qvar, symbicort and ventolin.  Pt was doing well for about one week then cough and wheezing came back.  Pt a1c 7.1 recently. Her blood sugar was 146 this morning fasting.   Review of Systems  Constitutional: Negative for chills, fatigue and fever.  HENT: Negative for congestion, ear pain, hearing loss, mouth sores, nosebleeds, postnasal drip, sinus pressure and sinus pain.   Respiratory: Positive for cough and wheezing. Negative for chest tightness.   Cardiovascular: Negative for chest pain and palpitations.  Gastrointestinal: Negative for abdominal pain, blood in stool and constipation.  Genitourinary: Negative for dysuria, flank pain and frequency.  Musculoskeletal: Negative for back pain and gait problem.  Skin: Negative for rash.  Neurological: Negative for dizziness, seizures, weakness and light-headedness.  Hematological: Negative for adenopathy. Does not bruise/bleed easily.  Psychiatric/Behavioral: Negative for behavioral problems, confusion and hallucinations. The patient is not nervous/anxious.    Past Medical History:  Diagnosis Date  . Anemia   . Diabetes mellitus without complication (HCC)   . GERD (gastroesophageal reflux disease)   . Polycystic ovary syndrome      Social History   Socioeconomic History  . Marital status: Married    Spouse name: Not on file  . Number of children: 1  . Years of education: LPN  . Highest education level: Not on file  Occupational History  . Not on file  Social Needs  . Financial resource  strain: Not on file  . Food insecurity:    Worry: Not on file    Inability: Not on file  . Transportation needs:    Medical: Not on file    Non-medical: Not on file  Tobacco Use  . Smoking status: Current Every Day Smoker  . Smokeless tobacco: Never Used  Substance and Sexual Activity  . Alcohol use: Yes    Comment: very rare infrequent. occaisional glass of wine.  . Drug use: No  . Sexual activity: Yes  Lifestyle  . Physical activity:    Days per week: 3 days    Minutes per session: 60 min  . Stress: Very much  Relationships  . Social connections:    Talks on phone: Not on file    Gets together: Not on file    Attends religious service: Not on file    Active member of club or organization: Not on file    Attends meetings of clubs or organizations: Not on file    Relationship status: Not on file  . Intimate partner violence:    Fear of current or ex partner: Not on file    Emotionally abused: Not on file    Physically abused: Not on file    Forced sexual activity: Not on file  Other Topics Concern  . Not on file  Social History Narrative  . Not on file    Past Surgical History:  Procedure Laterality Date  . CHOLECYSTECTOMY      Family History  Problem Relation Age of Onset  .  Diabetes Mother   . Cancer Mother   . Sickle cell anemia Father   . Alcohol abuse Father   . Colon cancer Father   . Diabetes Maternal Grandfather     Allergies  Allergen Reactions  . Other Anaphylaxis  . Shellfish Allergy Swelling    Current Outpatient Medications on File Prior to Visit  Medication Sig Dispense Refill  . albuterol (PROVENTIL) (2.5 MG/3ML) 0.083% nebulizer solution Take 3 mLs (2.5 mg total) by nebulization every 6 (six) hours as needed for wheezing or shortness of breath. 150 mL 1  . azithromycin (ZITHROMAX Z-PAK) 250 MG tablet 2 tabs a day the first day, then 1 tab a day x 4 days 6 tablet 0  . benzonatate (TESSALON) 100 MG capsule Take 1 capsule (100 mg total) by  mouth 3 (three) times daily as needed for cough. 30 capsule 0  . budesonide-formoterol (SYMBICORT) 160-4.5 MCG/ACT inhaler Inhale 2 puffs into the lungs 2 (two) times daily. 1 Inhaler 3  . buPROPion (WELLBUTRIN XL) 150 MG 24 hr tablet Take 1 tablet (150 mg total) by mouth daily. 30 tablet 1  . Exenatide ER 2 MG PEN Inject 2 mg into the skin every 7 (seven) days. 4 each 2  . fluticasone (FLONASE) 50 MCG/ACT nasal spray Place 2 sprays into both nostrils daily. 16 g 1  . HYDROcodone-homatropine (HYCODAN) 5-1.5 MG/5ML syrup Take 5 mLs by mouth every 8 (eight) hours as needed for cough. 100 mL 0  . lamoTRIgine (LAMICTAL) 100 MG tablet   5  . levocetirizine (XYZAL) 5 MG tablet Take 1 tablet (5 mg total) by mouth every evening. 30 tablet 0  . metFORMIN (GLUCOPHAGE) 500 MG tablet Take 1 tablet (500 mg total) by mouth 2 (two) times daily with a meal. 180 tablet 3  . pantoprazole (PROTONIX) 40 MG tablet Take 1 tablet (40 mg total) by mouth daily. 30 tablet 3  . phentermine 37.5 MG capsule Take 1 capsule (37.5 mg total) by mouth every morning. 30 capsule 0  . VENTOLIN HFA 108 (90 Base) MCG/ACT inhaler   0   No current facility-administered medications on file prior to visit.     BP (!) 140/121   Pulse (!) 107   Temp 98.5 F (36.9 C) (Oral)   Resp 16   Ht  (1.651 m)   Wt 297 lb 12.8 oz (135.1 kg)   SpO2 100%   BMI 49.56 kg/m       Objective:   Physical Exam  General  Mental Status - Alert. General Appearance - Well groomed. Not in acute distress.  Skin Rashes- No Rashes.  HEENT Head- Normal. Ear Auditory Canal - Left- Normal. Right - Normal.Tympanic Membrane- Left- Normal. Right- Normal. Eye Sclera/Conjunctiva- Left- Normal. Right- Normal. Nose & Sinuses Nasal Mucosa- Left-  Boggy and Congested. Right-  Boggy and  Congested.Bilateral maxillary and frontal sinus pressure. Mouth & Throat Lips: Upper Lip- Normal: no dryness, cracking, pallor, cyanosis, or vesicular eruption. Lower  Lip-Normal: no dryness, cracking, pallor, cyanosis or vesicular eruption. Buccal Mucosa- Bilateral- No Aphthous ulcers. Oropharynx- No Discharge or Erythema. Tonsils: Characteristics- Bilateral- No Erythema or Congestion. Size/Enlargement- Bilateral- No enlargement. Discharge- bilateral-None.  Neck Neck- Supple. No Masses.  Chest and Lung Exam Auscultation: Breath Sounds:-Clear even and unlabored. But mild shallow respirations.   Cardiovascular Auscultation:Rythm- Regular, rate and rhythm. Murmurs & Other Heart Sounds:Ausculatation of the heart reveal- No Murmurs.  Lymphatic Head & Neck General Head & Neck Lymphatics: Bilateral: Description- No Localized lymphadenopathy.  Lower ext- no pedal edema. Negative homans signs.      Assessment & Plan:  For your recurrent severe cough and wheezing, I do want you to get a chest x-ray today.  After review of x-ray might decide on prescribing another antibiotic.  I decided to go ahead and prescribe you 5-day taper dose of prednisone.  Also prescribing Hycodan again.  I think you will be able to fill that tomorrow or on Sunday..  Continue your allergy medications and your inhalers.  I want you to check your blood sugars fasting and before meals.  Continue by bydureon and metformin.  Unfortunately with a recent severe cough and wheezing as we approached the weekend feel it is necessary to give you the prednisone.  If your blood sugars are above 200 pre-meal then use a Humalog sliding scale that I gave.  If you feel weak or dizzy over the weekend would also recommend checking your sugars.  Work note given to return on Monday. Keep appointment with pulmonologist in July.  Follow-up with me in 7 days or as needed  If having to use insulin and pt get sugars less than 70 advised on foods to bring sugars up. However this I think will be unlikley. Advised not to use humalog past last day of taper prednisone course. Esperanza Richters, PA-C

## 2017-09-18 NOTE — Telephone Encounter (Signed)
Left message for pt to call to possibly schedule an appt to be scanned/molded for orthotics. They are covered at 80% after 300 deductible.

## 2017-09-19 ENCOUNTER — Telehealth: Payer: Self-pay | Admitting: Medical

## 2017-09-19 ENCOUNTER — Encounter: Payer: Self-pay | Admitting: Medical

## 2017-09-19 MED ORDER — AZITHROMYCIN 250 MG PO TABS: ORAL_TABLET | ORAL | 0 refills | Status: DC

## 2017-09-19 NOTE — Telephone Encounter (Signed)
rx azithromycin antibiotic sent to the pharmacy.

## 2017-09-21 MED FILL — AZITHROMYCIN 250 MG TABLET: 250 | 5 days supply | Qty: 6 | Fill #0

## 2017-09-25 ENCOUNTER — Encounter: Payer: 59 | Admitting: Obstetrics and Gynecology

## 2017-09-25 ENCOUNTER — Ambulatory Visit (HOSPITAL_BASED_OUTPATIENT_CLINIC_OR_DEPARTMENT_OTHER)
Admission: RE | Admit: 2017-09-25 | Discharge: 2017-09-25 | Disposition: A | Discharge status: Home / Self Care | Payer: 59 | Source: Ambulatory Visit | Attending: Podiatry | Admitting: Podiatry

## 2017-09-25 DIAGNOSIS — M7989 Other specified soft tissue disorders: Secondary | ICD-10-CM | POA: Diagnosis not present

## 2017-09-25 DIAGNOSIS — Z01419 Encounter for gynecological examination (general) (routine) without abnormal findings: Secondary | ICD-10-CM

## 2017-09-28 ENCOUNTER — Encounter: Payer: Self-pay | Admitting: Medical

## 2017-09-30 ENCOUNTER — Ambulatory Visit (HOSPITAL_BASED_OUTPATIENT_CLINIC_OR_DEPARTMENT_OTHER)
Admission: RE | Admit: 2017-09-30 | Discharge: 2017-09-30 | Disposition: A | Discharge status: Home / Self Care | Payer: 59 | Source: Ambulatory Visit | Attending: Medical | Admitting: Medical

## 2017-09-30 ENCOUNTER — Ambulatory Visit (INDEPENDENT_AMBULATORY_CARE_PROVIDER_SITE_OTHER): Payer: 59 | Admitting: Medical

## 2017-09-30 ENCOUNTER — Encounter: Payer: Self-pay | Admitting: Medical

## 2017-09-30 ENCOUNTER — Ambulatory Visit: Payer: 59 | Admitting: Medical

## 2017-09-30 VITALS — BP 134/77 | HR 100 | Temp 98.8°F | Resp 16 | Ht 65.0 in | Wt 298.0 lb

## 2017-09-30 DIAGNOSIS — R059 Cough, unspecified: Secondary | ICD-10-CM

## 2017-09-30 DIAGNOSIS — E118 Type 2 diabetes mellitus with unspecified complications: Secondary | ICD-10-CM

## 2017-09-30 DIAGNOSIS — J4540 Moderate persistent asthma, uncomplicated: Secondary | ICD-10-CM

## 2017-09-30 DIAGNOSIS — R0600 Dyspnea, unspecified: Secondary | ICD-10-CM | POA: Diagnosis not present

## 2017-09-30 DIAGNOSIS — R05 Cough: Secondary | ICD-10-CM | POA: Diagnosis not present

## 2017-09-30 DIAGNOSIS — R918 Other nonspecific abnormal finding of lung field: Secondary | ICD-10-CM | POA: Insufficient documentation

## 2017-09-30 MED ORDER — INSULIN LISPRO 100 UNIT/ML (KWIKPEN): PEN_INJECTOR | SUBCUTANEOUS | 2 refills | Status: DC

## 2017-09-30 MED ORDER — LEVOFLOXACIN 500 MG PO TABS: 500.0000 mg | ORAL_TABLET | Freq: Every day | ORAL | 0 refills | Status: DC

## 2017-09-30 MED ORDER — INSULIN ASPART 100 UNIT/ML FLEXPEN: PEN_INJECTOR | SUBCUTANEOUS | 2 refills | Status: DC

## 2017-09-30 MED ORDER — IPRATROPIUM-ALBUTEROL 0.5-2.5 (3) MG/3ML IN SOLN: 3.0000 mL | RESPIRATORY_TRACT | 0 refills | Status: AC | PRN

## 2017-09-30 MED ORDER — PREDNISONE 10 MG PO TABS: ORAL_TABLET | ORAL | 0 refills | Status: DC

## 2017-09-30 MED FILL — IPRAT-ALBUT 0.5-3(2.5) MG/3: 0.5-2.5 (3) | 30 days supply | Qty: 360 | Fill #0

## 2017-09-30 MED FILL — HUMALOG 100 UNITS/ML KWIKPE: 100 | 84 days supply | Qty: 9 | Fill #0

## 2017-09-30 MED FILL — predniSONE 10 MG TABS: 10 | 5 days supply | Qty: 15 | Fill #0

## 2017-09-30 MED FILL — levoFLOXacin 500 MG TABS: 500 | 7 days supply | Qty: 7 | Fill #0

## 2017-09-30 NOTE — Progress Notes (Signed)
Subjective:    Patient ID: Katie Luna, female    DOB: 1976/03/17, 42 y.o.   MRN: [id number]  HPI  Pt in stating she still has persistent cough. Pt states she has been using neb treatment sometimes as frequently as every 2 hours. She will have wheezing. I had advised her to go to ED by my chart but she did not think was necessary. Coworkers who have seen her cough have suggested ED but she declined.  Pt describes lung feel tight.   On last visit. I added 5 day taper prednisone, hycodan and I added zpack.   Pt sugar  went over 400 twice while on tapered prednisone. But she used sliding scale and sugars became under control.   Pt continues the xyzal, flonase, qvar and ventolin.  Pt does not report any calf pain. No popliteal pain.  Hx of asthma when younger.  Pt used to smoke but stopped 10 years ago. In march until around May she restarted after separation from her husband.   Pt sugar level was 172 earlier today.  Overall cough now form 2 months per patient but last month cough is worsening.  Review of Systems  Constitutional: Negative for chills, fatigue and fever.  HENT: Negative for congestion, drooling, ear pain, hearing loss and nosebleeds.   Respiratory: Positive for cough, shortness of breath and wheezing. Negative for chest tightness.   Cardiovascular: Negative for chest pain and palpitations.  Gastrointestinal: Negative for abdominal pain.  Musculoskeletal: Negative for myalgias and neck pain.  Skin: Negative for rash.  Neurological: Negative for dizziness and headaches.  Hematological: Negative for adenopathy. Does not bruise/bleed easily.  Psychiatric/Behavioral: Negative for behavioral problems, confusion and sleep disturbance. The patient is not nervous/anxious.     Past Medical History:  Diagnosis Date  . Anemia   . Diabetes mellitus without complication (HCC)   . GERD (gastroesophageal reflux disease)   . Polycystic ovary syndrome      Social  History   Socioeconomic History  . Marital status: Married    Spouse name: Not on file  . Number of children: 1  . Years of education: LPN  . Highest education level: Not on file  Occupational History  . Not on file  Social Needs  . Financial resource strain: Not on file  . Food insecurity:    Worry: Not on file    Inability: Not on file  . Transportation needs:    Medical: Not on file    Non-medical: Not on file  Tobacco Use  . Smoking status: Current Every Day Smoker  . Smokeless tobacco: Never Used  Substance and Sexual Activity  . Alcohol use: Yes    Comment: very rare infrequent. occaisional glass of wine.  . Drug use: No  . Sexual activity: Yes  Lifestyle  . Physical activity:    Days per week: 3 days    Minutes per session: 60 min  . Stress: Very much  Relationships  . Social connections:    Talks on phone: Not on file    Gets together: Not on file    Attends religious service: Not on file    Active member of club or organization: Not on file    Attends meetings of clubs or organizations: Not on file    Relationship status: Not on file  . Intimate partner violence:    Fear of current or ex partner: Not on file    Emotionally abused: Not on file    Physically  abused: Not on file    Forced sexual activity: Not on file  Other Topics Concern  . Not on file  Social History Narrative  . Not on file    Past Surgical History:  Procedure Laterality Date  . CHOLECYSTECTOMY      Family History  Problem Relation Age of Onset  . Diabetes Mother   . Cancer Mother   . Sickle cell anemia Father   . Alcohol abuse Father   . Colon cancer Father   . Diabetes Maternal Grandfather     Allergies  Allergen Reactions  . Other Anaphylaxis  . Shellfish Allergy Swelling    Current Outpatient Medications on File Prior to Visit  Medication Sig Dispense Refill  . albuterol (PROVENTIL) (2.5 MG/3ML) 0.083% nebulizer solution Take 3 mLs (2.5 mg total) by nebulization  every 6 (six) hours as needed for wheezing or shortness of breath. 150 mL 1  . benzonatate (TESSALON) 100 MG capsule Take 1 capsule (100 mg total) by mouth 3 (three) times daily as needed for cough. 30 capsule 0  . budesonide-formoterol (SYMBICORT) 160-4.5 MCG/ACT inhaler Inhale 2 puffs into the lungs 2 (two) times daily. 1 Inhaler 3  . buPROPion (WELLBUTRIN XL) 150 MG 24 hr tablet Take 1 tablet (150 mg total) by mouth daily. 30 tablet 1  . Exenatide ER 2 MG PEN Inject 2 mg into the skin every 7 (seven) days. 4 each 2  . fluticasone (FLONASE) 50 MCG/ACT nasal spray Place 2 sprays into both nostrils daily. 16 g 1  . HYDROcodone-homatropine (HYCODAN) 5-1.5 MG/5ML syrup Take 5 mLs by mouth every 8 (eight) hours as needed for cough. 100 mL 0  . lamoTRIgine (LAMICTAL) 100 MG tablet   5  . levocetirizine (XYZAL) 5 MG tablet Take 1 tablet (5 mg total) by mouth every evening. 30 tablet 0  . metFORMIN (GLUCOPHAGE) 500 MG tablet Take 1 tablet (500 mg total) by mouth 2 (two) times daily with a meal. 180 tablet 3  . pantoprazole (PROTONIX) 40 MG tablet Take 1 tablet (40 mg total) by mouth daily. 30 tablet 3  . phentermine 37.5 MG capsule Take 1 capsule (37.5 mg total) by mouth every morning. 30 capsule 0  . predniSONE (DELTASONE) 10 MG tablet 5 TAB PO DAY 1 4 TAB PO DAY 2 3 TAB PO DAY 3 2 TAB PO DAY 4 1 TAB PO DAY 5 15 tablet 0  . VENTOLIN HFA 108 (90 Base) MCG/ACT inhaler   0   No current facility-administered medications on file prior to visit.     BP 134/77 (BP Location: Left Arm, Patient Position: Sitting, Cuff Size: Large)   Pulse 100   Temp 98.8 F (37.1 C) (Oral)   Resp 16   Ht 5\' 5"  (1.651 m)   Wt 298 lb (135.2 kg)   SpO2 99%   BMI 49.59 kg/m       Objective:   Physical Exam  General  Mental Status - Alert. General Appearance - Well groomed. Not in acute distress. Cough not severe initially on exam. But at end of interview cough became severe again.  Skin Rashes- No  Rashes.  HEENT Head- Normal. Ear Auditory Canal - Left- Normal. Right - Normal.Tympanic Membrane- Left- Normal. Right- Normal. Eye Sclera/Conjunctiva- Left- Normal. Right- Normal. Nose & Sinuses Nasal Mucosa- Left-  Not boggy or Congested. Right-  Not  boggy or Congested. Mouth & Throat Lips: Upper Lip- Normal: no dryness, cracking, pallor, cyanosis, or vesicular eruption. Lower Lip-Normal: no  dryness, cracking, pallor, cyanosis or vesicular eruption. Buccal Mucosa- Bilateral- No Aphthous ulcers. Oropharynx- No Discharge or Erythema. +pnd. Tonsils: Characteristics- Bilateral- No Erythema or Congestion. Size/Enlargement- Bilateral- No enlargement. Discharge- bilateral-None.  Neck Neck- Supple. No Masses.   Chest and Lung Exam Auscultation: Breath Sounds:- even and unlabored, but bilateral upper lobe rhonchi.  Cardiovascular Auscultation:Rythm- Regular, rate and rhythm. Murmurs & Other Heart Sounds:Ausculatation of the heart reveal- No Murmurs.  Lymphatic Head & Neck General Head & Neck Lymphatics: Bilateral: Description- No Localized lymphadenopathy.  Lower ext- no pedal edema. Negative homans sign bilateral. Faint rt side lateral calf pain which she associates with ankle pain.     Assessment & Plan:  For your severe cough and dyspnea, I do recommend you fill the Hycodan prescription.  I need to add 5-day taper dose of prednisone again.  Continue current inhalers.  I am also sending DuoNeb solution prescription downstairs.  Use that every 4-6 hours as needed.  Continue other current inhalers.  Please get a d-dimer test today and get chest x-ray.  If d-dimer test is positive we will need to order CT angios of chest.  On the 5-day taper dose of prednisone you need to start NovoLog FlexPen sliding scale again as explained the other day.  After reviewing how you respond to the above treatment and studies will likely try to get your pulmonology appointment moved up.  Return to  work note for Saturday.  Follow-up in 7 days or as needed.  40 minutes spent with patient today.  50% of time was spent counseling regarding potential differential diagnosis of her constant cough and wheezing.  Explained work-up today and in the near future with pulmonologist.  Also need to counsel on potential impact of prednisone on her sugars but at the same time explaining need to improve her breathing.  And and also had to call patient and explained potential diagnosis of pneumonia found on chest x-ray today.  Esperanza RichtersEdward Dylann Gallier, PA-C

## 2017-09-30 NOTE — Patient Instructions (Addendum)
For your severe cough and dyspnea, I do recommend you fill the Hycodan prescription.  I need to add 5-day taper dose of prednisone again.  Continue current inhalers.  I am also sending DuoNeb solution prescription downstairs.  Use that every 4-6 hours as needed.  Continue other current inhalers.  Please get a d-dimer test today and get chest x-ray.  If d-dimer test is positive we will need to order CT angios of chest.  On the 5-day taper dose of prednisone you need to start NovoLog FlexPen sliding scale again as explained the other day.  After reviewing how you respond to the above treatment and studies will likely try to get your pulmonology appointment moved up.  Return to work note for Saturday.  Follow-up in 7 days or as needed.

## 2017-10-01 ENCOUNTER — Encounter: Payer: Self-pay | Admitting: Medical

## 2017-10-01 DIAGNOSIS — R062 Wheezing: Secondary | ICD-10-CM | POA: Diagnosis not present

## 2017-10-01 DIAGNOSIS — R0602 Shortness of breath: Secondary | ICD-10-CM | POA: Diagnosis not present

## 2017-10-01 DIAGNOSIS — J4520 Mild intermittent asthma, uncomplicated: Secondary | ICD-10-CM | POA: Diagnosis not present

## 2017-10-01 LAB — D-DIMER, QUANTITATIVE: D-Dimer, Quant: 0.27 mcg/mL FEU (ref <0.50)

## 2017-10-02 ENCOUNTER — Telehealth: Payer: Self-pay | Admitting: *Deleted

## 2017-10-02 NOTE — Telephone Encounter (Signed)
[facility]; forwarded to provider/SLS 06/14

## 2017-10-03 ENCOUNTER — Encounter: Payer: Self-pay | Admitting: Medical

## 2017-10-06 ENCOUNTER — Telehealth: Payer: Self-pay | Admitting: Medical

## 2017-10-06 MED ORDER — PHENTERMINE HCL 37.5 MG PO CAPS: 37.5000 mg | ORAL_CAPSULE | ORAL | 0 refills | Status: AC

## 2017-10-06 MED FILL — PHENTERMINE 37.5 MG CAPSULE: 37.5 | 30 days supply | Qty: 30 | Fill #0

## 2017-10-06 NOTE — Telephone Encounter (Signed)
Refilled pt phentermine.

## 2017-10-07 ENCOUNTER — Telehealth: Payer: Self-pay

## 2017-10-07 NOTE — Telephone Encounter (Signed)
PA initiated via Covermymeds; KEY: [id number]. Received response from insurance that this medication is a covered benefit. No PA needed.

## 2017-10-08 MED FILL — lamoTRIgine 100 MG TABS: 100 | 30 days supply | Qty: 45 | Fill #3

## 2017-10-09 ENCOUNTER — Encounter: Payer: Self-pay | Admitting: Obstetrics & Gynecology

## 2017-10-09 ENCOUNTER — Ambulatory Visit (INDEPENDENT_AMBULATORY_CARE_PROVIDER_SITE_OTHER): Payer: 59 | Admitting: Obstetrics & Gynecology

## 2017-10-09 VITALS — BP 117/86 | HR 93 | Ht 65.0 in | Wt 298.8 lb

## 2017-10-09 DIAGNOSIS — Z1151 Encounter for screening for human papillomavirus (HPV): Secondary | ICD-10-CM

## 2017-10-09 DIAGNOSIS — Z30011 Encounter for initial prescription of contraceptive pills: Secondary | ICD-10-CM | POA: Diagnosis not present

## 2017-10-09 DIAGNOSIS — Z01419 Encounter for gynecological examination (general) (routine) without abnormal findings: Secondary | ICD-10-CM | POA: Diagnosis not present

## 2017-10-09 DIAGNOSIS — Z124 Encounter for screening for malignant neoplasm of cervix: Secondary | ICD-10-CM | POA: Diagnosis not present

## 2017-10-09 MED ORDER — LEVONORGEST-ETH ESTRAD 91-DAY 0.15-0.03 &0.01 MG PO TABS: 1.0000 | ORAL_TABLET | Freq: Every day | ORAL | 4 refills | Status: DC

## 2017-10-09 MED FILL — DAYSEE 0.15-0.03-0.01 MG TA: 0.15-0.03 & | 91 days supply | Qty: 91 | Fill #0

## 2017-10-09 NOTE — Patient Instructions (Signed)
Oral Contraception Use Oral contraceptive pills (OCPs) are medicines taken to prevent pregnancy. OCPs work by preventing the ovaries from releasing eggs. The hormones in OCPs also cause the cervical mucus to thicken, preventing the sperm from entering the uterus. The hormones also cause the uterine lining to become thin, not allowing a fertilized egg to attach to the inside of the uterus. OCPs are highly effective when taken exactly as prescribed. However, OCPs do not prevent sexually transmitted diseases (STDs). Safe sex practices, such as using condoms along with an OCP, can help prevent STDs. Before taking OCPs, you may have a physical exam and Pap test. Your health care provider may also order blood tests if necessary. Your health care provider will make sure you are a good candidate for oral contraception. Discuss with your health care provider the possible side effects of the OCP you may be prescribed. When starting an OCP, it can take 2 to 3 months for the body to adjust to the changes in hormone levels in your body. How to take oral contraceptive pills Your health care provider may advise you on how to start taking the first cycle of OCPs. Otherwise, you can:  Start on day 1 of your menstrual period. You will not need any backup contraceptive protection with this start time.  Start on the first Sunday after your menstrual period or the day you get your prescription. In these cases, you will need to use backup contraceptive protection for the first week.  Start the pill at any time of your cycle. If you take the pill within 5 days of the start of your period, you are protected against pregnancy right away. In this case, you will not need a backup form of birth control. If you start at any other time of your menstrual cycle, you will need to use another form of birth control for 7 days. If your OCP is the type called a minipill, it will protect you from pregnancy after taking it for 2 days (48  hours).  After you have started taking OCPs:  If you forget to take 1 pill, take it as soon as you remember. Take the next pill at the regular time.  If you miss 2 or more pills, call your health care provider because different pills have different instructions for missed doses. Use backup birth control until your next menstrual period starts.  If you use a 28-day pack that contains inactive pills and you miss 1 of the last 7 pills (pills with no hormones), it will not matter. Throw away the rest of the non-hormone pills and start a new pill pack.  No matter which day you start the OCP, you will always start a new pack on that same day of the week. Have an extra pack of OCPs and a backup contraceptive method available in case you miss some pills or lose your OCP pack. Follow these instructions at home:  Do not smoke.  Always use a condom to protect against STDs. OCPs do not protect against STDs.  Use a calendar to mark your menstrual period days.  Read the information and directions that came with your OCP. Talk to your health care provider if you have questions. Contact a health care provider if:  You develop nausea and vomiting.  You have abnormal vaginal discharge or bleeding.  You develop a rash.  You miss your menstrual period.  You are losing your hair.  You need treatment for mood swings or depression.  You   get dizzy when taking the OCP.  You develop acne from taking the OCP.  You become pregnant. Get help right away if:  You develop chest pain.  You develop shortness of breath.  You have an uncontrolled or severe headache.  You develop numbness or slurred speech.  You develop visual problems.  You develop pain, redness, and swelling in the legs. This information is not intended to replace advice given to you by your health care provider. Make sure you discuss any questions you have with your health care provider. Document Released: 03/27/2011 Document  Revised: 09/13/2015 Document Reviewed: 09/26/2012 Elsevier Interactive Patient Education  2017 Elsevier Inc.  

## 2017-10-09 NOTE — Progress Notes (Signed)
History of PCOS. Pt states that she bled for ninety one days. Pt states that she has very heavy periods.

## 2017-10-09 NOTE — Progress Notes (Signed)
Patient ID: Katie Luna, female   DOB: 03/11/1976, 42 y.o.   MRN: [id number]  Chief Complaint  Patient presents with  . Gynecologic Exam    HPI Katie Luna is a 42 y.o. female.  G1P1001 No LMP recorded. (Menstrual status: Irregular Periods). Last period 2 mo ago. She had bleeding that lasted 91 days end of 2018 until Feb. Extended cycle OCP have worked in the past for cycle control with her history of PCOS. HPI  Past Medical History:  Diagnosis Date  . Anemia   . Diabetes mellitus without complication (HCC)   . GERD (gastroesophageal reflux disease)   . Polycystic ovary syndrome     Past Surgical History:  Procedure Laterality Date  . CHOLECYSTECTOMY      Family History  Problem Relation Age of Onset  . Diabetes Mother   . Cancer Mother   . Sickle cell anemia Father   . Alcohol abuse Father   . Colon cancer Father   . Diabetes Maternal Grandfather     Social History Social History   Tobacco Use  . Smoking status: Current Every Day Smoker  . Smokeless tobacco: Never Used  Substance Use Topics  . Alcohol use: Yes    Comment: very rare infrequent. occaisional glass of wine.  . Drug use: No    Allergies  Allergen Reactions  . Other Anaphylaxis  . Shellfish Allergy Swelling    Current Outpatient Medications  Medication Sig Dispense Refill  . albuterol (PROVENTIL) (2.5 MG/3ML) 0.083% nebulizer solution Take 3 mLs (2.5 mg total) by nebulization every 6 (six) hours as needed for wheezing or shortness of breath. 150 mL 1  . budesonide-formoterol (SYMBICORT) 160-4.5 MCG/ACT inhaler Inhale 2 puffs into the lungs 2 (two) times daily. 1 Inhaler 3  . buPROPion (WELLBUTRIN XL) 150 MG 24 hr tablet Take 1 tablet (150 mg total) by mouth daily. 30 tablet 1  . Exenatide ER 2 MG PEN Inject 2 mg into the skin every 7 (seven) days. 4 each 2  . fluticasone (FLONASE) 50 MCG/ACT nasal spray Place 2 sprays into both nostrils daily. 16 g 1  . insulin aspart (NOVOLOG) 100  UNIT/ML FlexPen 5 units premeal sugar(201-250) 8 units premeal sugar(251-300) 10 units premeal sugar(301-350) 12 units premeal sugar(351-400) 15 units premeal sugar(400-450) Above 450 advise ED eval. 3 mL 2  . insulin lispro (HUMALOG) 100 UNIT/ML KiwkPen 5 units before meals tid 3 mL 2  . ipratropium-albuterol (DUONEB) 0.5-2.5 (3) MG/3ML SOLN Take 3 mLs by nebulization every 4 (four) hours as needed. 360 mL 0  . lamoTRIgine (LAMICTAL) 100 MG tablet   5  . metFORMIN (GLUCOPHAGE) 500 MG tablet Take 1 tablet (500 mg total) by mouth 2 (two) times daily with a meal. 180 tablet 3  . pantoprazole (PROTONIX) 40 MG tablet Take 1 tablet (40 mg total) by mouth daily. 30 tablet 3  . phentermine 37.5 MG capsule Take 1 capsule (37.5 mg total) by mouth every morning. 30 capsule 0  . VENTOLIN HFA 108 (90 Base) MCG/ACT inhaler   0  . benzonatate (TESSALON) 100 MG capsule Take 1 capsule (100 mg total) by mouth 3 (three) times daily as needed for cough. (Patient not taking: Reported on 10/09/2017) 30 capsule 0  . HYDROcodone-homatropine (HYCODAN) 5-1.5 MG/5ML syrup Take 5 mLs by mouth every 8 (eight) hours as needed for cough. (Patient not taking: Reported on 10/09/2017) 100 mL 0  . levocetirizine (XYZAL) 5 MG tablet Take 1 tablet (5 mg total) by mouth  every evening. (Patient not taking: Reported on 10/09/2017) 30 tablet 0  . levofloxacin (LEVAQUIN) 500 MG tablet Take 1 tablet (500 mg total) by mouth daily. (Patient not taking: Reported on 10/09/2017) 7 tablet 0  . Levonorgestrel-Ethinyl Estradiol (AMETHIA,CAMRESE) 0.15-0.03 &0.01 MG tablet Take 1 tablet by mouth daily. 1 Package 4  . predniSONE (DELTASONE) 10 MG tablet 5 TAB PO DAY 1 4 TAB PO DAY 2 3 TAB PO DAY 3 2 TAB PO DAY 4 1 TAB PO DAY 5 (Patient not taking: Reported on 10/09/2017) 15 tablet 0   No current facility-administered medications for this visit.     Review of Systems Review of Systems  Constitutional: Negative.   Respiratory: Positive for  cough.   Cardiovascular: Negative.   Gastrointestinal: Negative.   Genitourinary: Positive for menstrual problem (irregular periods and sometimes prolonged).    Blood pressure 117/86, pulse 93, height 5\' 5"  (1.651 m), weight 298 lb 12.8 oz (135.5 kg).  Physical Exam Physical Exam  Constitutional: She appears well-developed.  Obese. Cough noted  HENT:  Head: Normocephalic.  Neck: Normal range of motion.  Cardiovascular: Normal rate.  Pulmonary/Chest: Effort normal.  Abdominal: Soft. There is no tenderness.  Genitourinary: Vagina normal and uterus normal. No vaginal discharge found.  Vitals reviewed. Breasts: breasts appear normal, no suspicious masses, no skin or nipple changes or axillary nodes.   Data Reviewed Medical history and office notes  Assessment    Patient Active Problem List   Diagnosis Date Noted  . Oral contraception initial prescription 10/09/2017  . Morbid obesity (HCC) 05/08/2017  . Polycystic ovarian syndrome 07/24/2010   Irregular periods previously managed with OCP. Former smoker    Microbiologist for cycle control Mammogram ordered Routine Gyn f/u       Scheryl Darter 10/09/2017, 10:51 AM

## 2017-10-10 ENCOUNTER — Ambulatory Visit (HOSPITAL_BASED_OUTPATIENT_CLINIC_OR_DEPARTMENT_OTHER)
Admission: RE | Admit: 2017-10-10 | Discharge: 2017-10-10 | Disposition: A | Discharge status: Home / Self Care | Payer: 59 | Source: Ambulatory Visit | Attending: Obstetrics & Gynecology | Admitting: Obstetrics & Gynecology

## 2017-10-10 DIAGNOSIS — Z01419 Encounter for gynecological examination (general) (routine) without abnormal findings: Secondary | ICD-10-CM | POA: Diagnosis not present

## 2017-10-10 DIAGNOSIS — Z1231 Encounter for screening mammogram for malignant neoplasm of breast: Secondary | ICD-10-CM | POA: Diagnosis not present

## 2017-10-15 LAB — CYTOLOGY - PAP
Diagnosis: NEGATIVE
HPV: NOT DETECTED

## 2017-10-19 ENCOUNTER — Telehealth: Payer: Self-pay | Admitting: *Deleted

## 2017-10-19 NOTE — Telephone Encounter (Signed)
Called pt - L/M   Needing to discuss BAKO and MRI results with pt.

## 2017-10-23 ENCOUNTER — Ambulatory Visit: Payer: 59 | Admitting: Endocrinology

## 2017-10-23 DIAGNOSIS — Z0289 Encounter for other administrative examinations: Secondary | ICD-10-CM

## 2017-10-26 ENCOUNTER — Encounter: Payer: Self-pay | Admitting: Medical

## 2017-10-27 ENCOUNTER — Telehealth: Payer: Self-pay | Admitting: Medical

## 2017-10-27 MED ORDER — VARENICLINE TARTRATE 0.5 MG X 11 & 1 MG X 42 PO MISC: ORAL | 0 refills | Status: DC

## 2017-10-27 NOTE — Telephone Encounter (Signed)
I tried to send chantix starter pack to pharmacy but it printed. Will you get that off printer and call it in downstairs to [facility] pharmacy.   Also will you ask G[place]enn for the number of pulmonologist office that called patient. I put in referral 2 months ago. But want to give patient the number for her to call.(name of MD as well)  But I would like to know which office as well if patient effort to call for appointment fails.

## 2017-10-28 ENCOUNTER — Encounter: Payer: Self-pay | Admitting: Medical

## 2017-10-28 NOTE — Telephone Encounter (Signed)
Pt has appointment with [facility] 10/29/17

## 2017-10-29 ENCOUNTER — Ambulatory Visit (INDEPENDENT_AMBULATORY_CARE_PROVIDER_SITE_OTHER): Payer: 59 | Admitting: Pulmonary Disease

## 2017-10-29 ENCOUNTER — Encounter: Payer: Self-pay | Admitting: Pulmonary Disease

## 2017-10-29 ENCOUNTER — Other Ambulatory Visit: Payer: Self-pay | Admitting: Pulmonary Disease

## 2017-10-29 VITALS — BP 130/74 | HR 99 | Ht 65.0 in | Wt 292.0 lb

## 2017-10-29 DIAGNOSIS — J301 Allergic rhinitis due to pollen: Secondary | ICD-10-CM

## 2017-10-29 DIAGNOSIS — J45909 Unspecified asthma, uncomplicated: Secondary | ICD-10-CM | POA: Diagnosis not present

## 2017-10-29 DIAGNOSIS — K219 Gastro-esophageal reflux disease without esophagitis: Secondary | ICD-10-CM

## 2017-10-29 DIAGNOSIS — R05 Cough: Secondary | ICD-10-CM

## 2017-10-29 DIAGNOSIS — R059 Cough, unspecified: Secondary | ICD-10-CM

## 2017-10-29 DIAGNOSIS — J479 Bronchiectasis, uncomplicated: Secondary | ICD-10-CM

## 2017-10-29 DIAGNOSIS — R062 Wheezing: Secondary | ICD-10-CM | POA: Diagnosis not present

## 2017-10-29 DIAGNOSIS — R0602 Shortness of breath: Secondary | ICD-10-CM | POA: Diagnosis not present

## 2017-10-29 DIAGNOSIS — J4520 Mild intermittent asthma, uncomplicated: Secondary | ICD-10-CM | POA: Diagnosis not present

## 2017-10-29 MED ORDER — AEROCHAMBER MV MISC: 2 refills | Status: AC

## 2017-10-29 NOTE — Patient Instructions (Signed)
Asthma with recurrent exacerbation: Use Symbicort 2 puffs twice a day with a spacer We will check a lung function test We will check and exhaled nitric oxide test to look for eosinophilic inflammation in the airways We will repeat a complete blood count with differential and serum IgE to look for evidence of underlying allergy Use albuterol as needed for chest tightness wheezing or shortness of breath  Allergic rhinitis: Continue Flonase 2 sprays each nostril daily Continue Xyzal  Gastroesophageal reflux disease: Continue Protonix and Zantac.  Specifically take Protonix 40 mg in the morning and Zantac in the evening.  Cough: I think you need to rest your voice. You need to try to suppress your cough to allow your larynx (voice box) to heal.  For three days don't talk, laugh, sing, or clear your throat. Do everything you can to suppress the cough during this time. Use hard candies (sugarless Jolly Ranchers) or non-mint or non-menthol containing cough drops during this time to soothe your throat.  Use a cough suppressant (Delsym or what I have prescribed you) around the clock during this time.  After three days, gradually increase the use of your voice and back off on the cough suppressants.  We will see you back in 2 to 4 weeks with either myself or a nurse practitioner to go over the results of the breathing test and the CT scan.

## 2017-10-29 NOTE — Progress Notes (Signed)
Synopsis: Referred in July 2019 for cough  Subjective:   PATIENT ID: Katie Luna GENDER: female DOB: [birth year], MRN: [id number]   HPI  Chief Complaint  Patient presents with  . pulm consult    Pt referred by Dr. Andria Meuse MD. Pt has increase of asthma attacks, dry cough for last 2 months. Pt has increase pain in left side, chest tightness, SOB with exertion.    Katie Luna is here to see me for "consistent issues": persistent cough, recent asthma attack, recent pneumonia.  She says that over the years she has always had a lot of respiratory problems after a cold.  Typically when she gets sick she says that she will need to take an antibiotic and will have ongoing dyspnea and has a feeling of pain in her left side.  She says that it feels like her left chest feels "like drum" and feels like her left lung doesn't move air well.   She says that as long as she stays home rather than moving around she will feel OK.  However if she moves around a lot and walks a lot she will feel more substernal discomfort like there is a tickle or irritation deep in her chest.  She says that when she breathes deep she feel like there is little air movement on that side.    She tells me that she moved here from [place] recently.  As a child she had eczema, asthma with recurrent bronchitis, and allergic rhinitis.  She says that her eczema has been more of a problem recently specifically in her scalp.  She has been taking Symbicort twice a day.  She denies heartburn symptoms.  She has been taking Protonix in the morning and Zantac at night.  She has been using size all, and Flonase for postnasal drip which she says is controlling the symptoms fairly well.  When she has been on prednisone she has had elevated blood sugars.   Past Medical History:  Diagnosis Date  . Anemia   . Diabetes mellitus without complication (HCC)   . GERD (gastroesophageal reflux disease)   . Polycystic ovary syndrome       Family History  Problem Relation Age of Onset  . Diabetes Mother   . Cancer Mother   . Sickle cell anemia Father   . Alcohol abuse Father   . Colon cancer Father   . Diabetes Maternal Grandfather      Social History   Socioeconomic History  . Marital status: Married    Spouse name: Not on file  . Number of children: 1  . Years of education: LPN  . Highest education level: Not on file  Occupational History  . Not on file  Social Needs  . Financial resource strain: Not on file  . Food insecurity:    Worry: Not on file    Inability: Not on file  . Transportation needs:    Medical: Not on file    Non-medical: Not on file  Tobacco Use  . Smoking status: Former Smoker    Years: 29.00    Types: Cigarettes    Last attempt to quit: 10/19/2017    Years since quitting: 0.0  . Smokeless tobacco: Never Used  Substance and Sexual Activity  . Alcohol use: Yes    Comment: very rare infrequent. occaisional glass of wine.  . Drug use: No  . Sexual activity: Yes  Lifestyle  . Physical activity:    Days per week: 3 days  Minutes per session: 60 min  . Stress: Very much  Relationships  . Social connections:    Talks on phone: Not on file    Gets together: Not on file    Attends religious service: Not on file    Active member of club or organization: Not on file    Attends meetings of clubs or organizations: Not on file    Relationship status: Not on file  . Intimate partner violence:    Fear of current or ex partner: Not on file    Emotionally abused: Not on file    Physically abused: Not on file    Forced sexual activity: Not on file  Other Topics Concern  . Not on file  Social History Narrative  . Not on file     Allergies  Allergen Reactions  . Other Anaphylaxis  . Shellfish Allergy Swelling     Outpatient Medications Prior to Visit  Medication Sig Dispense Refill  . albuterol (PROVENTIL) (2.5 MG/3ML) 0.083% nebulizer solution Take 3 mLs (2.5 mg total) by  nebulization every 6 (six) hours as needed for wheezing or shortness of breath. 150 mL 1  . benzonatate (TESSALON) 100 MG capsule Take 1 capsule (100 mg total) by mouth 3 (three) times daily as needed for cough. 30 capsule 0  . budesonide-formoterol (SYMBICORT) 160-4.5 MCG/ACT inhaler Inhale 2 puffs into the lungs 2 (two) times daily. 1 Inhaler 3  . buPROPion (WELLBUTRIN XL) 150 MG 24 hr tablet Take 1 tablet (150 mg total) by mouth daily. 30 tablet 1  . Exenatide ER 2 MG PEN Inject 2 mg into the skin every 7 (seven) days. 4 each 2  . fluticasone (FLONASE) 50 MCG/ACT nasal spray Place 2 sprays into both nostrils daily. 16 g 1  . HYDROcodone-homatropine (HYCODAN) 5-1.5 MG/5ML syrup Take 5 mLs by mouth every 8 (eight) hours as needed for cough. 100 mL 0  . insulin aspart (NOVOLOG) 100 UNIT/ML FlexPen 5 units premeal sugar(201-250) 8 units premeal sugar(251-300) 10 units premeal sugar(301-350) 12 units premeal sugar(351-400) 15 units premeal sugar(400-450) Above 450 advise ED eval. 3 mL 2  . insulin lispro (HUMALOG) 100 UNIT/ML KiwkPen 5 units before meals tid 3 mL 2  . ipratropium-albuterol (DUONEB) 0.5-2.5 (3) MG/3ML SOLN Take 3 mLs by nebulization every 4 (four) hours as needed. 360 mL 0  . lamoTRIgine (LAMICTAL) 100 MG tablet   5  . levocetirizine (XYZAL) 5 MG tablet Take 1 tablet (5 mg total) by mouth every evening. 30 tablet 0  . levofloxacin (LEVAQUIN) 500 MG tablet Take 1 tablet (500 mg total) by mouth daily. 7 tablet 0  . Levonorgestrel-Ethinyl Estradiol (AMETHIA,CAMRESE) 0.15-0.03 &0.01 MG tablet Take 1 tablet by mouth daily. 1 Package 4  . metFORMIN (GLUCOPHAGE) 500 MG tablet Take 1 tablet (500 mg total) by mouth 2 (two) times daily with a meal. 180 tablet 3  . pantoprazole (PROTONIX) 40 MG tablet Take 1 tablet (40 mg total) by mouth daily. 30 tablet 3  . phentermine 37.5 MG capsule Take 1 capsule (37.5 mg total) by mouth every morning. 30 capsule 0  . predniSONE (DELTASONE) 10 MG tablet  5 TAB PO DAY 1 4 TAB PO DAY 2 3 TAB PO DAY 3 2 TAB PO DAY 4 1 TAB PO DAY 5 15 tablet 0  . varenicline (CHANTIX PAK) 0.5 MG X 11 & 1 MG X 42 tablet Take one 0.5 mg tablet by mouth once daily for 3 days, then increase to one 0.5 mg  tablet twice daily for 4 days, then increase to one 1 mg tablet twice daily. 53 tablet 0  . VENTOLIN HFA 108 (90 Base) MCG/ACT inhaler   0   No facility-administered medications prior to visit.     Review of Systems  HENT: Positive for congestion, ear pain and sore throat.   Respiratory: Positive for cough, shortness of breath and wheezing.   Cardiovascular: Positive for chest pain, palpitations and leg swelling.  Neurological: Positive for headaches.  Psychiatric/Behavioral: Positive for depression.      Objective:  Physical Exam   Vitals:   10/29/17 1437  BP: 130/74  Pulse: 99  SpO2: 98%  Weight: 292 lb (132.5 kg)  Height: 5\' 5"  (1.651 m)    RA  Gen: obese, frequent cough HENT: NCAT, OP with enlarged tonsils, neck supple without masses,  Eyes: PERRL, EOMi Lymph: no cervical lymphadenopathy PULM: CTA B, normal air movement CV: RRR, no mgr, no JVD GI: BS+, soft, nontender, no hsm Derm: no rash or skin breakdown MSK: normal bulk and tone Neuro: A&Ox4, CN II-XII intact, strength 5/5 in all 4 extremities Psyche: normal mood and affect   CBC    Component Value Date/Time   WBC 6.1 09/17/2017 0703   RBC 3.68 (L) 09/17/2017 0703   HGB 10.1 (L) 09/17/2017 0703   HCT 31.2 (L) 09/17/2017 0703   PLT 269.0 09/17/2017 0703   MCV 84.8 09/17/2017 0703   MCHC 32.3 09/17/2017 0703   RDW 16.3 (H) 09/17/2017 0703   LYMPHSABS 2.2 09/17/2017 0703   MONOABS 0.5 09/17/2017 0703   EOSABS 0.3 09/17/2017 0703   BASOSABS 0.0 09/17/2017 0703     Chest imaging: May 2019 chest x-ray images independently reviewed showing Normal cardiac silhouette,  normal-appearing pulmonary parenchyma  PFT:  Labs: June 2019 d-dimer  negative  Path:  Echo:  Heart Catheterization:  June 2019 primary care doctor's office visit where she was seen for cough.  She had been treated with prednisone, Z-Pak, narcotic cough medicine.  Prednisone because blood sugar to be elevated.  Was using Flonase, Qvar, Ventolin, Xyzal   Restarted smoking in May 2019      Assessment & Plan:   Uncomplicated asthma, unspecified asthma severity, unspecified whether persistent - Plan: Pulmonary function test, POCT EXHALED NITRIC OXIDE, CBC With Differential, IgE  Cough - Plan: Pulmonary function test, POCT EXHALED NITRIC OXIDE, CBC With Differential, IgE, CT Chest High Resolution  Bronchiectasis without acute exacerbation (HCC) - Plan: Pulmonary function test, POCT EXHALED NITRIC OXIDE, CBC With Differential, IgE  Morbid obesity (HCC)  Gastroesophageal reflux disease without esophagitis  Seasonal allergic rhinitis due to pollen  Discussion: Jhordan is here to see me for persistent cough, intermittent wheezing and shortness of breath which has been a problem for her for several months now.  She has a history of asthma and notably earlier this year on a blood test her serum eosinophil count was elevated.  The differential diagnosis of her problems are lengthy.  She may in fact have severe persistent asthma due to an underlying allergy, though upper airway laryngeal irritation is also likely.  Though she denies heartburn I think that GERD may be contributing given her high risk with this due to her obesity.  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  Finally, I am not sure what to make of her unilateral symptoms.  Specifically she says she always feels that there is  problem with air movement on the left and interestingly she has been found to have an abnormality on the left chest in the retrocardiac area on the most recent chest x-ray.   I think given the severity of her symptoms and recurrent doctor's visits is best to evaluate this initially with a CT scan and then potentially consider bronchoscopy.  Plan: Asthma with recurrent exacerbation: Use Symbicort 2 puffs twice a day with a spacer We will check a lung function test We will check and exhaled nitric oxide test to look for eosinophilic inflammation in the airways We will repeat a complete blood count with differential and serum IgE to look for evidence of underlying allergy Use albuterol as needed for chest tightness wheezing or shortness of breath  Allergic rhinitis: Continue Flonase 2 sprays each nostril daily Continue Xyzal  Gastroesophageal reflux disease: Continue Protonix and Zantac.  Specifically take Protonix 40 mg in the morning and Zantac in the evening.  Cough: I think you need to rest your voice. You need to try to suppress your cough to allow your larynx (voice box) to heal.  For three days don't talk, laugh, sing, or clear your throat. Do everything you can to suppress the cough during this time. Use hard candies (sugarless Jolly Ranchers) or non-mint or non-menthol containing cough drops during this time to soothe your throat.  Use a cough suppressant (Delsym or what I have prescribed you) around the clock during this time.  After three days, gradually increase the use of your voice and back off on the cough suppressants.  We will see you back in 2 to 4 weeks with either myself or a nurse practitioner to go over the results of the breathing test and the CT scan.   Current Outpatient Medications:  .  albuterol (PROVENTIL) (2.5 MG/3ML) 0.083% nebulizer solution, Take 3 mLs (2.5 mg total) by nebulization every 6 (six) hours as needed for wheezing or shortness of breath., Disp: 150 mL, Rfl: 1 .  benzonatate (TESSALON) 100 MG capsule, Take 1 capsule (100 mg total) by mouth 3 (three) times daily as needed for cough., Disp: 30 capsule, Rfl: 0 .   budesonide-formoterol (SYMBICORT) 160-4.5 MCG/ACT inhaler, Inhale 2 puffs into the lungs 2 (two) times daily., Disp: 1 Inhaler, Rfl: 3 .  buPROPion (WELLBUTRIN XL) 150 MG 24 hr tablet, Take 1 tablet (150 mg total) by mouth daily., Disp: 30 tablet, Rfl: 1 .  Exenatide ER 2 MG PEN, Inject 2 mg into the skin every 7 (seven) days., Disp: 4 each, Rfl: 2 .  fluticasone (FLONASE) 50 MCG/ACT nasal spray, Place 2 sprays into both nostrils daily., Disp: 16 g, Rfl: 1 .  HYDROcodone-homatropine (HYCODAN) 5-1.5 MG/5ML syrup, Take 5 mLs by mouth every 8 (eight) hours as needed for cough., Disp: 100 mL, Rfl: 0 .  insulin aspart (NOVOLOG) 100 UNIT/ML FlexPen, 5 units premeal sugar(201-250) 8 units premeal sugar(251-300) 10 units premeal sugar(301-350) 12 units premeal sugar(351-400) 15 units premeal sugar(400-450) Above 450 advise ED eval., Disp: 3 mL, Rfl: 2 .  insulin lispro (HUMALOG) 100 UNIT/ML KiwkPen, 5 units before meals tid, Disp: 3 mL, Rfl: 2 .  ipratropium-albuterol (DUONEB) 0.5-2.5 (3) MG/3ML SOLN, Take 3 mLs by nebulization every 4 (four) hours as needed., Disp: 360 mL, Rfl: 0 .  lamoTRIgine (LAMICTAL) 100 MG tablet, , Disp: , Rfl: 5 .  levocetirizine (XYZAL) 5 MG tablet, Take 1 tablet (5 mg total) by mouth every evening., Disp: 30 tablet, Rfl: 0 .  levofloxacin (LEVAQUIN)  500 MG tablet, Take 1 tablet (500 mg total) by mouth daily., Disp: 7 tablet, Rfl: 0 .  Levonorgestrel-Ethinyl Estradiol (AMETHIA,CAMRESE) 0.15-0.03 &0.01 MG tablet, Take 1 tablet by mouth daily., Disp: 1 Package, Rfl: 4 .  metFORMIN (GLUCOPHAGE) 500 MG tablet, Take 1 tablet (500 mg total) by mouth 2 (two) times daily with a meal., Disp: 180 tablet, Rfl: 3 .  pantoprazole (PROTONIX) 40 MG tablet, Take 1 tablet (40 mg total) by mouth daily., Disp: 30 tablet, Rfl: 3 .  phentermine 37.5 MG capsule, Take 1 capsule (37.5 mg total) by mouth every morning., Disp: 30 capsule, Rfl: 0 .  predniSONE (DELTASONE) 10 MG tablet, 5 TAB PO DAY 1 4 TAB PO  DAY 2 3 TAB PO DAY 3 2 TAB PO DAY 4 1 TAB PO DAY 5, Disp: 15 tablet, Rfl: 0 .  varenicline (CHANTIX PAK) 0.5 MG X 11 & 1 MG X 42 tablet, Take one 0.5 mg tablet by mouth once daily for 3 days, then increase to one 0.5 mg tablet twice daily for 4 days, then increase to one 1 mg tablet twice daily., Disp: 53 tablet, Rfl: 0 .  VENTOLIN HFA 108 (90 Base) MCG/ACT inhaler, , Disp: , Rfl: 0 .  Spacer/Aero-Holding Chambers (AEROCHAMBER MV) inhaler, Use as instructed, Disp: 1 each, Rfl: 2

## 2017-11-01 LAB — CBC WITH DIFFERENTIAL
Basophils Absolute: 0.1 10*3/uL (ref 0.0–0.2)
Basos: 1 %
EOS (ABSOLUTE): 0.2 10*3/uL (ref 0.0–0.4)
Eos: 3 %
Hematocrit: 34.4 % (ref 34.0–46.6)
Hemoglobin: 11 g/dL — ABNORMAL LOW (ref 11.1–15.9)
Immature Grans (Abs): 0 10*3/uL (ref 0.0–0.1)
Immature Granulocytes: 0 %
Lymphocytes Absolute: 3.2 10*3/uL — ABNORMAL HIGH (ref 0.7–3.1)
Lymphs: 43 %
MCH: 26.4 pg — ABNORMAL LOW (ref 26.6–33.0)
MCHC: 32 g/dL (ref 31.5–35.7)
MCV: 83 fL (ref 79–97)
Monocytes Absolute: 0.5 10*3/uL (ref 0.1–0.9)
Monocytes: 6 %
Neutrophils Absolute: 3.5 10*3/uL (ref 1.4–7.0)
Neutrophils: 47 %
RBC: 4.16 x10E6/uL (ref 3.77–5.28)
RDW: 16.3 % — ABNORMAL HIGH (ref 12.3–15.4)
WBC: 7.5 10*3/uL (ref 3.4–10.8)

## 2017-11-01 LAB — IGE: IgE (Immunoglobulin E), Serum: 100 IU/mL (ref 6–495)

## 2017-11-02 ENCOUNTER — Encounter: Payer: Self-pay | Admitting: *Deleted

## 2017-11-02 NOTE — Progress Notes (Signed)
July

## 2017-11-02 NOTE — Telephone Encounter (Signed)
Letter to make an appt to discuss results of US and fungal culture sent to pt.

## 2017-11-15 ENCOUNTER — Encounter: Payer: Self-pay | Admitting: Medical

## 2017-11-17 ENCOUNTER — Ambulatory Visit (HOSPITAL_BASED_OUTPATIENT_CLINIC_OR_DEPARTMENT_OTHER)
Admission: RE | Admit: 2017-11-17 | Discharge: 2017-11-17 | Disposition: A | Discharge status: Home / Self Care | Payer: 59 | Source: Ambulatory Visit | Attending: Pulmonary Disease | Admitting: Pulmonary Disease

## 2017-11-17 DIAGNOSIS — R05 Cough: Secondary | ICD-10-CM | POA: Insufficient documentation

## 2017-11-17 DIAGNOSIS — R059 Cough, unspecified: Secondary | ICD-10-CM

## 2017-11-17 MED FILL — lamoTRIgine 100 MG TABS: 100 | 30 days supply | Qty: 45 | Fill #4

## 2017-11-18 ENCOUNTER — Encounter: Payer: Self-pay | Admitting: Medical

## 2017-11-19 ENCOUNTER — Telehealth: Payer: Self-pay | Admitting: Medical

## 2017-11-19 ENCOUNTER — Encounter: Payer: Self-pay | Admitting: Medical

## 2017-11-19 ENCOUNTER — Telehealth: Payer: Self-pay | Admitting: Pulmonary Disease

## 2017-11-19 NOTE — Telephone Encounter (Signed)
Notes recorded by Lupita LeashMcQuaid, Douglas B, MD on 11/18/2017 at 5:13 PM EDT BJ, Please let the patient know this was OK except she had a goiter, needs to discuss that with PCP Thanks, B ----------------------------------------- Spoke with pt. She is aware of results. I have faxed a copy of her CT to her PCP. Nothing further was needed.

## 2017-11-19 NOTE — Telephone Encounter (Signed)
Pt ct of chest report mentioned possible mulinodular goiter. So will you ask her to follow up in 3 weeks with me. Want to feel her thyroid. May get tsh, t4 and thyroid us on appointment.

## 2017-11-20 NOTE — Telephone Encounter (Signed)
You sent me back message routed to me but nothing attached except my note? So can you resend?

## 2017-11-23 NOTE — Telephone Encounter (Signed)
Called Pt and informed the below, pt stated did not have her calender with her and will call tomorrow 11-24-2017 morning to schedule the 3 wk fu appt with Ramon DredgeEdward.

## 2017-11-30 MED FILL — lamoTRIgine 200 MG TABS: 200 | 30 days supply | Qty: 30 | Fill #1

## 2017-11-30 MED FILL — PANTOPRAZOLE SOD DR 40 MG T: 40 | 30 days supply | Qty: 30 | Fill #1

## 2017-12-04 ENCOUNTER — Encounter: Payer: Self-pay | Admitting: Podiatry

## 2017-12-04 ENCOUNTER — Encounter: Payer: Self-pay | Admitting: Medical

## 2017-12-04 ENCOUNTER — Ambulatory Visit: Payer: 59 | Admitting: Medical

## 2017-12-04 VITALS — BP 125/88 | HR 104 | Temp 99.6°F | Resp 16 | Ht 65.0 in | Wt 291.2 lb

## 2017-12-04 DIAGNOSIS — Z8709 Personal history of other diseases of the respiratory system: Secondary | ICD-10-CM

## 2017-12-04 DIAGNOSIS — E669 Obesity, unspecified: Secondary | ICD-10-CM

## 2017-12-04 DIAGNOSIS — I83813 Varicose veins of bilateral lower extremities with pain: Secondary | ICD-10-CM | POA: Diagnosis not present

## 2017-12-04 DIAGNOSIS — E049 Nontoxic goiter, unspecified: Secondary | ICD-10-CM | POA: Diagnosis not present

## 2017-12-04 DIAGNOSIS — J309 Allergic rhinitis, unspecified: Secondary | ICD-10-CM

## 2017-12-04 DIAGNOSIS — R05 Cough: Secondary | ICD-10-CM | POA: Diagnosis not present

## 2017-12-04 DIAGNOSIS — R059 Cough, unspecified: Secondary | ICD-10-CM

## 2017-12-04 LAB — TSH: TSH: 0.42 u[IU]/mL (ref 0.35–4.50)

## 2017-12-04 LAB — T4, FREE: Free T4: 1.4 ng/dL (ref 0.60–1.60)

## 2017-12-04 MED ORDER — MONTELUKAST SODIUM 10 MG PO TABS: 10.0000 mg | ORAL_TABLET | Freq: Every day | ORAL | 3 refills | Status: DC

## 2017-12-04 NOTE — Patient Instructions (Signed)
I am glad to see that your cough has improved.  Also your lungs sound clear today on exam.  Continue inhalers that pulmonologist has recommended.  He thought there was likely allergy component to your symptoms so please take the Xyzal and Flonase nasal spray.  I did send montelukast to your pharmacy as well.  For your recent varicose veins that are painful, I did place referral to vascular surgeon.  On exam I do not think you have superficial phlebitis.  But if areas become more tender then you could apply warm compresses and use low-dose ibuprofen for pain/inflammation.  For recent multinodular goiter found on CT and some thyromegaly on exam, I did place orders for you to get TSH and T4 today.  Also please go downstairs and schedule soft tissue/neck to evaluate thyroid region.  Follow-up in 2 months or as needed.

## 2017-12-04 NOTE — Progress Notes (Signed)
Subjective:    Patient ID: Katie Luna, female    DOB: 04/28/1975, 42 y.o.   MRN: [id number]  HPI  Pt in for follow up.  Pt has seen pulmonologist. Pt had CT of chest. Pt told to continue symbicort and albuterol. Pt states he thought allergies were probably causing/contributing to her symptoms. Pt got air purifery for her house. Breathing much better now. Much less cough.  Pt saw podiatrist for rt ankle ganglion lateral aspect. It is large enough that she may get surgery.  Pt had probable multinodular goiter on ct.   Pt last a1c was 7.1  2 months ago. Her appointment with endocrine Sept 10,2019.   Also very small bilateral upper varicose veins. Faint tender/achy. No calf swelling. No popliteal pain.    Review of Systems  Constitutional: Negative for chills, fatigue and fever.  Respiratory: Positive for cough. Negative for chest tightness.        Rare cough presently.  Gastrointestinal: Negative for abdominal distention, abdominal pain, constipation and nausea.  Endocrine: Negative for polydipsia and polyphagia.  Genitourinary: Negative for dysuria, flank pain and frequency.  Musculoskeletal:       See hpi on thigh varicose veins  Skin: Negative for rash.  Neurological: Negative for dizziness, seizures, syncope, weakness and light-headedness.  Hematological: Negative for adenopathy.  Psychiatric/Behavioral: Negative for behavioral problems and confusion.   Past Medical History:  Diagnosis Date  . Anemia   . Diabetes mellitus without complication (HCC)   . GERD (gastroesophageal reflux disease)   . Polycystic ovary syndrome      Social History   Socioeconomic History  . Marital status: Married    Spouse name: Not on file  . Number of children: 1  . Years of education: LPN  . Highest education level: Not on file  Occupational History  . Not on file  Social Needs  . Financial resource strain: Not on file  . Food insecurity:    Worry: Not on file   Inability: Not on file  . Transportation needs:    Medical: Not on file    Non-medical: Not on file  Tobacco Use  . Smoking status: Former Smoker    Years: 29.00    Types: Cigarettes    Last attempt to quit: 10/19/2017    Years since quitting: 0.1  . Smokeless tobacco: Never Used  Substance and Sexual Activity  . Alcohol use: Yes    Comment: very rare infrequent. occaisional glass of wine.  . Drug use: No  . Sexual activity: Yes  Lifestyle  . Physical activity:    Days per week: 3 days    Minutes per session: 60 min  . Stress: Very much  Relationships  . Social connections:    Talks on phone: Not on file    Gets together: Not on file    Attends religious service: Not on file    Active member of club or organization: Not on file    Attends meetings of clubs or organizations: Not on file    Relationship status: Not on file  . Intimate partner violence:    Fear of current or ex partner: Not on file    Emotionally abused: Not on file    Physically abused: Not on file    Forced sexual activity: Not on file  Other Topics Concern  . Not on file  Social History Narrative  . Not on file    Past Surgical History:  Procedure Laterality Date  . CHOLECYSTECTOMY  Family History  Problem Relation Age of Onset  . Diabetes Mother   . Cancer Mother   . Sickle cell anemia Father   . Alcohol abuse Father   . Colon cancer Father   . Diabetes Maternal Grandfather     Allergies  Allergen Reactions  . Other Anaphylaxis  . Shellfish Allergy Swelling    Current Outpatient Medications on File Prior to Visit  Medication Sig Dispense Refill  . albuterol (PROVENTIL) (2.5 MG/3ML) 0.083% nebulizer solution Take 3 mLs (2.5 mg total) by nebulization every 6 (six) hours as needed for wheezing or shortness of breath. 150 mL 1  . benzonatate (TESSALON) 100 MG capsule Take 1 capsule (100 mg total) by mouth 3 (three) times daily as needed for cough. 30 capsule 0  . budesonide-formoterol  (SYMBICORT) 160-4.5 MCG/ACT inhaler Inhale 2 puffs into the lungs 2 (two) times daily. 1 Inhaler 3  . buPROPion (WELLBUTRIN XL) 150 MG 24 hr tablet Take 1 tablet (150 mg total) by mouth daily. 30 tablet 1  . Exenatide ER 2 MG PEN Inject 2 mg into the skin every 7 (seven) days. 4 each 2  . fluticasone (FLONASE) 50 MCG/ACT nasal spray Place 2 sprays into both nostrils daily. 16 g 1  . HYDROcodone-homatropine (HYCODAN) 5-1.5 MG/5ML syrup Take 5 mLs by mouth every 8 (eight) hours as needed for cough. 100 mL 0  . insulin aspart (NOVOLOG) 100 UNIT/ML FlexPen 5 units premeal sugar(201-250) 8 units premeal sugar(251-300) 10 units premeal sugar(301-350) 12 units premeal sugar(351-400) 15 units premeal sugar(400-450) Above 450 advise ED eval. 3 mL 2  . insulin lispro (HUMALOG) 100 UNIT/ML KiwkPen 5 units before meals tid 3 mL 2  . ipratropium-albuterol (DUONEB) 0.5-2.5 (3) MG/3ML SOLN Take 3 mLs by nebulization every 4 (four) hours as needed. 360 mL 0  . lamoTRIgine (LAMICTAL) 100 MG tablet   5  . levocetirizine (XYZAL) 5 MG tablet Take 1 tablet (5 mg total) by mouth every evening. 30 tablet 0  . levofloxacin (LEVAQUIN) 500 MG tablet Take 1 tablet (500 mg total) by mouth daily. 7 tablet 0  . Levonorgestrel-Ethinyl Estradiol (AMETHIA,CAMRESE) 0.15-0.03 &0.01 MG tablet Take 1 tablet by mouth daily. 1 Package 4  . metFORMIN (GLUCOPHAGE) 500 MG tablet Take 1 tablet (500 mg total) by mouth 2 (two) times daily with a meal. 180 tablet 3  . pantoprazole (PROTONIX) 40 MG tablet Take 1 tablet (40 mg total) by mouth daily. 30 tablet 3  . phentermine 37.5 MG capsule Take 1 capsule (37.5 mg total) by mouth every morning. 30 capsule 0  . Spacer/Aero-Holding Chambers (AEROCHAMBER MV) inhaler Use as instructed 1 each 2  . varenicline (CHANTIX PAK) 0.5 MG X 11 & 1 MG X 42 tablet Take one 0.5 mg tablet by mouth once daily for 3 days, then increase to one 0.5 mg tablet twice daily for 4 days, then increase to one 1 mg  tablet twice daily. 53 tablet 0  . VENTOLIN HFA 108 (90 Base) MCG/ACT inhaler   0   No current facility-administered medications on file prior to visit.     BP 125/88   Pulse (!) 104   Temp 99.6 F (37.6 C) (Oral)   Resp 16   Ht 5\' 5"  (1.651 m)   Wt 291 lb 3.2 oz (132.1 kg)   SpO2 100%   BMI 48.46 kg/m       Objective:   Physical Exam  General Mental Status- Alert. General Appearance- Not in acute distress.  Skin General: Color- Normal Color. Moisture- Normal Moisture.  Both thighs- lateral asepect small varicose veins but not enflamed presently. Faint tender to palpation.  Neck Carotid Arteries- Normal color. Moisture- Normal Moisture. No carotid bruits. No JVD. Mild enlarged thyroid. Feels symmetric.  Chest and Lung Exam Auscultation: Breath Sounds:-Normal.  Cardiovascular Auscultation:Rythm- Regular. Murmurs & Other Heart Sounds:Auscultation of the heart reveals- No Murmurs.  Abdomen Inspection:-Inspeection Normal. Palpation/Percussion:Note:No mass. Palpation and Percussion of the abdomen reveal- Non Tender, Non Distended + BS, no rebound or guarding.  Neurologic Cranial Nerve exam:- CN III-XII intact(No nystagmus), symmetric smile.  strength:- 5/5 equal and symmetric strength both upper and lower extremities.  Lower ext- calfs symmetric, no swelling. Negative homans sign.   Rt ankle- ganglion cyst.      Assessment & Plan:  I am glad to see that your cough has improved.  Also your lungs sound clear today on exam.  Continue inhalers that pulmonologist has recommended.  He thought there was likely allergy component to your symptoms so please take the Xyzal and Flonase nasal spray.  I did send montelukast to your pharmacy as well.  For your recent varicose veins that are painful, I did place referral to vascular surgeon.  On exam I do not think you have superficial phlebitis.  But if areas become more tender then you could apply warm compresses and use  low-dose ibuprofen for pain/inflammation.  For recent multinodular goiter found on CT and some thyromegaly on exam, I did place orders for you to get TSH and T4 today.  Also please go downstairs and schedule soft tissue/neck to evaluate thyroid region.  Follow-up in 2 months or as needed.  Esperanza Richters, PA-C

## 2017-12-10 ENCOUNTER — Other Ambulatory Visit (HOSPITAL_BASED_OUTPATIENT_CLINIC_OR_DEPARTMENT_OTHER): Payer: 59

## 2017-12-11 ENCOUNTER — Ambulatory Visit (HOSPITAL_BASED_OUTPATIENT_CLINIC_OR_DEPARTMENT_OTHER): Payer: 59

## 2017-12-11 ENCOUNTER — Telehealth: Payer: Self-pay | Admitting: *Deleted

## 2017-12-11 NOTE — Telephone Encounter (Signed)
Received Physician Orders from [facility] for Physical Statement and Health Status; forwarded to provider/SLS 08/23

## 2017-12-14 ENCOUNTER — Telehealth: Payer: Self-pay | Admitting: Medical

## 2017-12-14 NOTE — Telephone Encounter (Signed)
I got physical/health status form to fill out. On 12/04/2017 pt was doing well with her chronic health problems. I filled out based on most current office visit. Will give to [facility] C[facility] to fax.

## 2017-12-14 NOTE — Telephone Encounter (Signed)
Form faxed

## 2017-12-18 ENCOUNTER — Ambulatory Visit (INDEPENDENT_AMBULATORY_CARE_PROVIDER_SITE_OTHER): Payer: 59 | Admitting: Pulmonary Disease

## 2017-12-18 DIAGNOSIS — R05 Cough: Secondary | ICD-10-CM

## 2017-12-18 NOTE — Progress Notes (Signed)
NO SHOW

## 2017-12-22 ENCOUNTER — Other Ambulatory Visit: Payer: Self-pay

## 2017-12-22 DIAGNOSIS — I779 Disorder of arteries and arterioles, unspecified: Secondary | ICD-10-CM

## 2017-12-24 ENCOUNTER — Encounter: Payer: Self-pay | Admitting: Medical

## 2017-12-24 MED FILL — MONTELUKAST SOD 10 MG TAB: 10 | 30 days supply | Qty: 30 | Fill #0

## 2017-12-24 MED FILL — BYDUREON 2 MG PEN INJECT: 2 | 28 days supply | Qty: 4 | Fill #1

## 2017-12-24 MED FILL — lamoTRIgine 200 MG TABS: 200 | 30 days supply | Qty: 30 | Fill #2

## 2017-12-24 MED FILL — lamoTRIgine 100 MG TABS: 100 | 30 days supply | Qty: 45 | Fill #5

## 2017-12-25 ENCOUNTER — Other Ambulatory Visit: Payer: Self-pay | Admitting: Medical

## 2017-12-25 ENCOUNTER — Encounter: Payer: Self-pay | Admitting: Medical

## 2017-12-28 NOTE — Telephone Encounter (Signed)
Pt called to check status. Pt would like a copy of her physical for a job. Please advise. Cb#[phone]

## 2017-12-29 ENCOUNTER — Ambulatory Visit: Payer: 59 | Admitting: Endocrinology

## 2017-12-29 DIAGNOSIS — Z0289 Encounter for other administrative examinations: Secondary | ICD-10-CM

## 2018-01-05 NOTE — Telephone Encounter (Signed)
Patient is calling and states the fax number this note needs to be faxed to is [phone]

## 2018-01-13 NOTE — Telephone Encounter (Signed)
Patient would like the letter of fitness for work to be re-faxed to [phone].

## 2018-02-01 ENCOUNTER — Encounter: Payer: Self-pay | Admitting: Medical

## 2018-02-01 MED FILL — DAYSEE 0.15-0.03-0.01 MG TA: 0.15-0.03 & | 91 days supply | Qty: 91 | Fill #1

## 2018-02-01 MED FILL — MONTELUKAST SOD 10 MG TAB: 10 | 30 days supply | Qty: 30 | Fill #1

## 2018-02-01 MED FILL — lamoTRIgine 200 MG TABS: 200 | 30 days supply | Qty: 30 | Fill #3

## 2018-02-01 MED FILL — BYDUREON 2 MG PEN INJECT: 2 | 28 days supply | Qty: 4 | Fill #2

## 2018-02-11 ENCOUNTER — Encounter: Payer: Self-pay | Admitting: Medical

## 2018-02-11 ENCOUNTER — Encounter: Payer: Self-pay | Admitting: Podiatry

## 2018-02-19 ENCOUNTER — Ambulatory Visit: Payer: 59 | Admitting: Medical

## 2018-02-19 ENCOUNTER — Encounter: Payer: Self-pay | Admitting: Medical

## 2018-02-19 VITALS — BP 134/74 | HR 92 | Temp 98.2°F | Resp 16 | Ht 65.0 in | Wt 294.6 lb

## 2018-02-19 DIAGNOSIS — F439 Reaction to severe stress, unspecified: Secondary | ICD-10-CM

## 2018-02-19 DIAGNOSIS — M6740 Ganglion, unspecified site: Secondary | ICD-10-CM | POA: Diagnosis not present

## 2018-02-19 DIAGNOSIS — E118 Type 2 diabetes mellitus with unspecified complications: Secondary | ICD-10-CM

## 2018-02-19 DIAGNOSIS — E786 Lipoprotein deficiency: Secondary | ICD-10-CM | POA: Diagnosis not present

## 2018-02-19 DIAGNOSIS — J439 Emphysema, unspecified: Secondary | ICD-10-CM

## 2018-02-19 DIAGNOSIS — J45909 Unspecified asthma, uncomplicated: Secondary | ICD-10-CM

## 2018-02-19 DIAGNOSIS — L309 Dermatitis, unspecified: Secondary | ICD-10-CM

## 2018-02-19 DIAGNOSIS — F17200 Nicotine dependence, unspecified, uncomplicated: Secondary | ICD-10-CM

## 2018-02-19 LAB — COMPREHENSIVE METABOLIC PANEL
ALT: 9 U/L (ref 0–35)
AST: 9 U/L (ref 0–37)
Albumin: 3.8 g/dL (ref 3.5–5.2)
Alkaline Phosphatase: 81 U/L (ref 39–117)
BUN: 12 mg/dL (ref 6–23)
CO2: 27 mEq/L (ref 19–32)
Calcium: 9.2 mg/dL (ref 8.4–10.5)
Chloride: 105 mEq/L (ref 96–112)
Creatinine, Ser: 0.68 mg/dL (ref 0.40–1.20)
GFR: 121.8 mL/min (ref >60.00)
Glucose, Bld: 170 mg/dL — ABNORMAL HIGH (ref 70–99)
Potassium: 4.4 mEq/L (ref 3.5–5.1)
Sodium: 139 mEq/L (ref 135–145)
Total Bilirubin: 0.5 mg/dL (ref 0.2–1.2)
Total Protein: 7.1 g/dL (ref 6.0–8.3)

## 2018-02-19 LAB — LIPID PANEL
Cholesterol: 145 mg/dL (ref 0–200)
HDL: 36.6 mg/dL — ABNORMAL LOW (ref >39.00)
LDL Cholesterol: 94 mg/dL (ref 0–99)
NonHDL: 108.72
Total CHOL/HDL Ratio: 4
Triglycerides: 74 mg/dL (ref 0.0–149.0)
VLDL: 14.8 mg/dL (ref 0.0–40.0)

## 2018-02-19 LAB — HEMOGLOBIN A1C: Hgb A1c MFr Bld: 8 % — ABNORMAL HIGH (ref 4.6–6.5)

## 2018-02-19 MED ORDER — BUDESONIDE-FORMOTEROL FUMARATE 160-4.5 MCG/ACT IN AERO: 2.0000 | INHALATION_SPRAY | Freq: Two times a day (BID) | RESPIRATORY_TRACT | 3 refills | Status: DC

## 2018-02-19 MED ORDER — BUPROPION HCL ER (XL) 150 MG PO TB24: 150.0000 mg | ORAL_TABLET | Freq: Every day | ORAL | 1 refills | Status: DC

## 2018-02-19 MED ORDER — KETOCONAZOLE 2 % EX SHAM: 1.0000 "application " | MEDICATED_SHAMPOO | CUTANEOUS | 1 refills | Status: AC

## 2018-02-19 MED ORDER — HYDROCORTISONE 2.5 % EX OINT: TOPICAL_OINTMENT | Freq: Two times a day (BID) | CUTANEOUS | 0 refills | Status: DC

## 2018-02-19 MED FILL — HYDROCORTISONE 2.5% OINT: 2.5 | 10 days supply | Qty: 28 | Fill #0

## 2018-02-19 MED FILL — SYMBICORT 160-4.5 MCG INH: 160-4.5 | 30 days supply | Qty: 10 | Fill #0

## 2018-02-19 MED FILL — buPROPion HCL ER (XL) 150 M: 150 | 30 days supply | Qty: 30 | Fill #0

## 2018-02-19 MED FILL — KETOCONAZOLE 2% SHAMPOO: 2 | 30 days supply | Qty: 120 | Fill #0

## 2018-02-19 NOTE — Progress Notes (Signed)
Subjective:    Patient ID: Katie Luna, female    DOB: Dec 30, 1975, 42 y.o.   MRN: [id number]  HPI  Pt in for follow up.  She states that she is feeling fine. A little stressed today as she almost had accident on the way over today. Got off work and was very sleepy.  Since I saw her last she has seen pulmonologist. She states her breathing is a lot better now. Pt stopped smoking briefly but then recently with very chaotic schedule started to smoke again but not as heavy. Pt states when all wellbutrin her mood was better but she ran out of wellbutrin.   Recently was briefly working out at gym up until her work demands increased. One 2 week schedule worked 127 hours.   Pt has diabetes. She has not seen her endocrinologist in a while. Last 2 week sugars in high mid to high 100's. Some 200 range. Last a1c ws 7.1 5 months ago. She has not seen endorcrine in some time. Can't remember when or name of md.  She also reports rt lower ankle abnormality. Size of lump is worsening. Some mild pain.   In addition pt report chronic facial rash intermittent worse during winter. Rash appears on side of nose. In past hydrocortisone helps.  She also notes chronic intermttent scalp rash for which she finds head and shoulder helps and in past used ketoconazole.    Review of Systems  Constitutional: Negative for chills, fatigue and fever.  HENT: Negative for congestion and ear pain.   Respiratory: Negative for cough, chest tightness, shortness of breath and wheezing.        Wheezing is much better now.  Cardiovascular: Negative for chest pain and palpitations.  Gastrointestinal: Negative for abdominal pain.  Genitourinary: Negative for difficulty urinating and dysuria.  Musculoskeletal: Negative for back pain.       Rt side ankle lump per pt.  Skin: Positive for rash.       Recent rash on face/nares crease. In past would use hydrocortisone 2.5%.  Also some scalp irritation. In past pt states  ketoconazle   Neurological: Negative for dizziness, weakness and numbness.  Hematological: Negative for adenopathy. Does not bruise/bleed easily.  Psychiatric/Behavioral: Negative for behavioral problems and confusion.    Past Medical History:  Diagnosis Date  . Anemia   . Diabetes mellitus without complication (HCC)   . GERD (gastroesophageal reflux disease)   . Polycystic ovary syndrome      Social History   Socioeconomic History  . Marital status: Married    Spouse name: Not on file  . Number of children: 1  . Years of education: LPN  . Highest education level: Not on file  Occupational History  . Not on file  Social Needs  . Financial resource strain: Not on file  . Food insecurity:    Worry: Not on file    Inability: Not on file  . Transportation needs:    Medical: Not on file    Non-medical: Not on file  Tobacco Use  . Smoking status: Former Smoker    Years: 29.00    Types: Cigarettes    Last attempt to quit: 10/19/2017    Years since quitting: 0.3  . Smokeless tobacco: Never Used  Substance and Sexual Activity  . Alcohol use: Yes    Comment: very rare infrequent. occaisional glass of wine.  . Drug use: No  . Sexual activity: Yes  Lifestyle  . Physical activity:  Days per week: 3 days    Minutes per session: 60 min  . Stress: Very much  Relationships  . Social connections:    Talks on phone: Not on file    Gets together: Not on file    Attends religious service: Not on file    Active member of club or organization: Not on file    Attends meetings of clubs or organizations: Not on file    Relationship status: Not on file  . Intimate partner violence:    Fear of current or ex partner: Not on file    Emotionally abused: Not on file    Physically abused: Not on file    Forced sexual activity: Not on file  Other Topics Concern  . Not on file  Social History Narrative  . Not on file    Past Surgical History:  Procedure Laterality Date  .  CHOLECYSTECTOMY      Family History  Problem Relation Age of Onset  . Diabetes Mother   . Cancer Mother   . Sickle cell anemia Father   . Alcohol abuse Father   . Colon cancer Father   . Diabetes Maternal Grandfather     Allergies  Allergen Reactions  . Other Anaphylaxis  . Shellfish Allergy Swelling    Current Outpatient Medications on File Prior to Visit  Medication Sig Dispense Refill  . albuterol (PROVENTIL) (2.5 MG/3ML) 0.083% nebulizer solution Take 3 mLs (2.5 mg total) by nebulization every 6 (six) hours as needed for wheezing or shortness of breath. 150 mL 1  . budesonide-formoterol (SYMBICORT) 160-4.5 MCG/ACT inhaler Inhale 2 puffs into the lungs 2 (two) times daily. 1 Inhaler 3  . buPROPion (WELLBUTRIN XL) 150 MG 24 hr tablet Take 1 tablet (150 mg total) by mouth daily. 30 tablet 1  . Exenatide ER 2 MG PEN Inject 2 mg into the skin every 7 (seven) days. 4 each 2  . fluticasone (FLONASE) 50 MCG/ACT nasal spray Place 2 sprays into both nostrils daily. 16 g 1  . HYDROcodone-homatropine (HYCODAN) 5-1.5 MG/5ML syrup Take 5 mLs by mouth every 8 (eight) hours as needed for cough. 100 mL 0  . insulin lispro (HUMALOG KWIKPEN) 100 UNIT/ML KiwkPen INJECT 5 UNITS BEFORE MEALS 3 TIMES DAILY - OR HIGHER PER SLIDING SCALE. MAXIMUM OF 45 UNITS PER DAY 3 mL 2  . ipratropium-albuterol (DUONEB) 0.5-2.5 (3) MG/3ML SOLN Take 3 mLs by nebulization every 4 (four) hours as needed. 360 mL 0  . lamoTRIgine (LAMICTAL) 100 MG tablet   5  . lamoTRIgine (LAMICTAL) 200 MG tablet   99  . levocetirizine (XYZAL) 5 MG tablet Take 1 tablet (5 mg total) by mouth every evening. 30 tablet 0  . levofloxacin (LEVAQUIN) 500 MG tablet Take 1 tablet (500 mg total) by mouth daily. 7 tablet 0  . Levonorgestrel-Ethinyl Estradiol (AMETHIA,CAMRESE) 0.15-0.03 &0.01 MG tablet Take 1 tablet by mouth daily. 1 Package 4  . metFORMIN (GLUCOPHAGE) 500 MG tablet Take 1 tablet (500 mg total) by mouth 2 (two) times daily with a  meal. 180 tablet 3  . montelukast (SINGULAIR) 10 MG tablet Take 1 tablet (10 mg total) by mouth at bedtime. 30 tablet 3  . pantoprazole (PROTONIX) 40 MG tablet Take 1 tablet (40 mg total) by mouth daily. 30 tablet 3  . phentermine 37.5 MG capsule Take 1 capsule (37.5 mg total) by mouth every morning. 30 capsule 0  . Spacer/Aero-Holding Chambers (AEROCHAMBER MV) inhaler Use as instructed 1 each 2  .  varenicline (CHANTIX PAK) 0.5 MG X 11 & 1 MG X 42 tablet Take one 0.5 mg tablet by mouth once daily for 3 days, then increase to one 0.5 mg tablet twice daily for 4 days, then increase to one 1 mg tablet twice daily. 53 tablet 0  . VENTOLIN HFA 108 (90 Base) MCG/ACT inhaler   0   No current facility-administered medications on file prior to visit.     BP 134/74   Pulse 92   Temp 98.2 F (36.8 C) (Oral)   Resp 16   Ht 5\' 5"  (1.651 m)   Wt 294 lb 9.6 oz (133.6 kg)   SpO2 100%   BMI 49.02 kg/m       Objective:   Physical Exam  General Mental Status- Alert. General Appearance- Not in acute distress.   Skin General: Color- Normal Color. Moisture- Normal Moisture. Faint mild rash side of cheeks adjacent to nares.  Scalp- mild dry flaky appearance to scalp  Neck Carotid Arteries- Normal color. Moisture- Normal Moisture. No carotid bruits. No JVD.  Chest and Lung Exam Auscultation: Breath Sounds:-Normal.  Cardiovascular Auscultation:Rythm- Regular. Murmurs & Other Heart Sounds:Auscultation of the heart reveals- No Murmurs.  Abdomen Inspection:-Inspeection Normal. Palpation/Percussion:Note:No mass. Palpation and Percussion of the abdomen reveal- Non Tender, Non Distended + BS, no rebound or guarding.  Neurologic Cranial Nerve exam:- CN III-XII intact(No nystagmus), symmetric smile. Strength:- 5/5 equal and symmetric strength both upper and lower extremities.  Lower ext- foot exam. See quality metrics.(rt ankle moderat-large ganglion cyst over talofibulrar area. Measures 6 cm x  6 cm.      Assessment & Plan:  For history of diabetes, continue with current medication regimen.  Will get metabolic panel and A1c today.  We will let you know results and if we need to modify treatment.  But I do want you to go ahead and get reestablished with your former endocrinologist.  Since you already have one you should be able to just call and get back in with them.  But if having problems with getting this scheduled please let me know.  For COPD, continue with Symbicort, DuoNeb, and montelukast.  For smoking cessation, I refilled your Wellbutrin.  For right ankle moderate size ganglion cyst, please get back in with the podiatrist as you might need to have surgery on that size of a ganglion cyst.  For low HDL, getting a lipid panel today.  For intermittent dermatitis/possible eczema of face, I refilled your hydrocortisone.  Keep your face moisturized particularly during the cold dry months potentially in the winter.  Try to limit use of the hydrocortisone.  For scalp dermatitis, I refilled your ketoconazole as you report that that has helped in the past.  If you want referral to a dermatologist please let me know.  Follow-up date to be determined after lab review.  40 minute spent with pt. 50% of time spent counseling on various diagnoses and treatment plans.  Esperanza Richters, PA-C

## 2018-02-19 NOTE — Patient Instructions (Signed)
For history of diabetes, continue with current medication regimen.  Will get metabolic panel and A1c today.  We will let you know results and if we need to modify treatment.  But I do want you to go ahead and get reestablished with your former endocrinologist.  Since you already have one you should be able to just call and get back in with them.  But if having problems with getting this scheduled please let me know.  For COPD, continue with Symbicort, DuoNeb, and montelukast.  For smoking cessation, I refilled your Wellbutrin.  For right ankle moderate size ganglion cyst, please get back in with the podiatrist as you might need to have surgery on that size of a ganglion cyst.  For low HDL, getting a lipid panel today.  For intermittent dermatitis/possible eczema of face, I refilled your hydrocortisone.  Keep your face moisturized particularly during the cold dry months potentially in the winter.  Try to limit use of the hydrocortisone.  For scalp dermatitis, I refilled your ketoconazole as you report that that has helped in the past.  If you want referral to a dermatologist please let me know.  Follow-up date to be determined after lab review.

## 2018-02-21 ENCOUNTER — Encounter: Payer: Self-pay | Admitting: Medical

## 2018-02-23 ENCOUNTER — Encounter: Payer: 59 | Admitting: Vascular Surgery

## 2018-02-23 ENCOUNTER — Encounter (HOSPITAL_COMMUNITY): Payer: 59

## 2018-02-24 ENCOUNTER — Encounter: Payer: Self-pay | Admitting: Endocrinology

## 2018-02-24 ENCOUNTER — Ambulatory Visit: Payer: 59 | Admitting: Endocrinology

## 2018-02-24 DIAGNOSIS — Z794 Long term (current) use of insulin: Secondary | ICD-10-CM | POA: Diagnosis not present

## 2018-02-24 DIAGNOSIS — E119 Type 2 diabetes mellitus without complications: Secondary | ICD-10-CM | POA: Diagnosis not present

## 2018-02-24 MED ORDER — SEMAGLUTIDE (1 MG/DOSE) 2 MG/1.5ML ~~LOC~~ SOPN: 1.0000 mg | PEN_INJECTOR | SUBCUTANEOUS | 11 refills | Status: DC

## 2018-02-24 MED ORDER — EMPAGLIFLOZIN 10 MG PO TABS: 10.0000 mg | ORAL_TABLET | Freq: Every day | ORAL | 3 refills | Status: DC

## 2018-02-24 MED FILL — OZEMPIC 1 MG/DOSE SOPN: 2 | 28 days supply | Qty: 3 | Fill #0

## 2018-02-24 NOTE — Progress Notes (Signed)
Subjective:    Patient ID: Katie Luna, female    DOB: 1975/06/24, 42 y.o.   MRN: [id number]  HPI pt is referred by Esperanza Richters, PA, for diabetes.  Pt states DM was dx'ed in 2004 (she had GDM in 1998); she has mild if any neuropathy of the lower extremities; she is unaware of any associated chronic complications; she has been on insulin since 2018; pt says her diet and exercise are poor; she has never had pancreatic surgery or DKA.  She has had several episode of GB pancreatitis (none since cholecystect in 1998).  She had severe hypoglycemia in 2004.  She takes metformin, bydureon, and prn humalog (averages approx 15 unit total per day).  She says bydureon causes knots on the abdomen.   Past Medical History:  Diagnosis Date  . Anemia   . Diabetes mellitus without complication (HCC)   . GERD (gastroesophageal reflux disease)   . Polycystic ovary syndrome     Past Surgical History:  Procedure Laterality Date  . CHOLECYSTECTOMY      Social History   Socioeconomic History  . Marital status: Married    Spouse name: Not on file  . Number of children: 1  . Years of education: LPN  . Highest education level: Not on file  Occupational History  . Not on file  Social Needs  . Financial resource strain: Not on file  . Food insecurity:    Worry: Not on file    Inability: Not on file  . Transportation needs:    Medical: Not on file    Non-medical: Not on file  Tobacco Use  . Smoking status: Former Smoker    Years: 29.00    Types: Cigarettes    Last attempt to quit: 10/19/2017    Years since quitting: 0.3  . Smokeless tobacco: Never Used  Substance and Sexual Activity  . Alcohol use: Yes    Comment: very rare infrequent. occaisional glass of wine.  . Drug use: No  . Sexual activity: Yes  Lifestyle  . Physical activity:    Days per week: 3 days    Minutes per session: 60 min  . Stress: Very much  Relationships  . Social connections:    Talks on phone: Not on file   Gets together: Not on file    Attends religious service: Not on file    Active member of club or organization: Not on file    Attends meetings of clubs or organizations: Not on file    Relationship status: Not on file  . Intimate partner violence:    Fear of current or ex partner: Not on file    Emotionally abused: Not on file    Physically abused: Not on file    Forced sexual activity: Not on file  Other Topics Concern  . Not on file  Social History Narrative  . Not on file    Current Outpatient Medications on File Prior to Visit  Medication Sig Dispense Refill  . albuterol (PROVENTIL) (2.5 MG/3ML) 0.083% nebulizer solution Take 3 mLs (2.5 mg total) by nebulization every 6 (six) hours as needed for wheezing or shortness of breath. 150 mL 1  . budesonide-formoterol (SYMBICORT) 160-4.5 MCG/ACT inhaler Inhale 2 puffs into the lungs 2 (two) times daily. 1 Inhaler 3  . buPROPion (WELLBUTRIN XL) 150 MG 24 hr tablet Take 1 tablet (150 mg total) by mouth daily. 30 tablet 1  . fluticasone (FLONASE) 50 MCG/ACT nasal spray Place 2 sprays into  both nostrils daily. 16 g 1  . hydrocortisone 2.5 % ointment Apply topically 2 (two) times daily. 30 g 0  . insulin lispro (HUMALOG KWIKPEN) 100 UNIT/ML KiwkPen INJECT 5 UNITS BEFORE MEALS 3 TIMES DAILY - OR HIGHER PER SLIDING SCALE. MAXIMUM OF 45 UNITS PER DAY 3 mL 2  . ipratropium-albuterol (DUONEB) 0.5-2.5 (3) MG/3ML SOLN Take 3 mLs by nebulization every 4 (four) hours as needed. 360 mL 0  . ketoconazole (NIZORAL) 2 % shampoo Apply 1 application topically 2 (two) times a week. 120 mL 1  . lamoTRIgine (LAMICTAL) 100 MG tablet   5  . lamoTRIgine (LAMICTAL) 200 MG tablet   99  . levocetirizine (XYZAL) 5 MG tablet Take 1 tablet (5 mg total) by mouth every evening. 30 tablet 0  . Levonorgestrel-Ethinyl Estradiol (AMETHIA,CAMRESE) 0.15-0.03 &0.01 MG tablet Take 1 tablet by mouth daily. 1 Package 4  . metFORMIN (GLUCOPHAGE) 500 MG tablet Take 1 tablet (500 mg  total) by mouth 2 (two) times daily with a meal. 180 tablet 3  . montelukast (SINGULAIR) 10 MG tablet Take 1 tablet (10 mg total) by mouth at bedtime. 30 tablet 3  . pantoprazole (PROTONIX) 40 MG tablet Take 1 tablet (40 mg total) by mouth daily. 30 tablet 3  . phentermine 37.5 MG capsule Take 1 capsule (37.5 mg total) by mouth every morning. 30 capsule 0  . Spacer/Aero-Holding Chambers (AEROCHAMBER MV) inhaler Use as instructed 1 each 2  . VENTOLIN HFA 108 (90 Base) MCG/ACT inhaler   0   No current facility-administered medications on file prior to visit.     Allergies  Allergen Reactions  . Other Anaphylaxis  . Shellfish Allergy Swelling    Family History  Problem Relation Age of Onset  . Diabetes Mother   . Cancer Mother   . Sickle cell anemia Father   . Alcohol abuse Father   . Colon cancer Father   . Diabetes Maternal Grandfather     BP 124/70 (BP Location: Left Arm, Patient Position: Sitting, Cuff Size: Large)   Pulse 90   Ht 5\' 5"  (1.651 m)   Wt 294 lb 9.6 oz (133.6 kg)   SpO2 97%   BMI 49.02 kg/m    Review of Systems denies  blurry vision, chest pain, n/v, urinary frequency, excessive diaphoresis, memory loss, rhinorrhea, and easy bruising. She has weight gain, doe, dry mouth, muscle cramps, cold intolerance, and intermitt headache.  Depression is well-controlled     Objective:   Physical Exam VS: see vs page GEN: no distress HEAD: head: no deformity eyes: no periorbital swelling, no proptosis external nose and ears are normal mouth: no lesion seen NECK: 5 cm right thyroid mass is noted CHEST WALL: no deformity LUNGS: clear to auscultation CV: reg rate and rhythm, no murmur ABD: abdomen is soft, nontender.  no hepatosplenomegaly.  not distended.  no hernia MUSCULOSKELETAL: muscle bulk and strength are grossly normal.  no obvious joint swelling.  gait is normal and steady EXTEMITIES: no deformity.  no ulcer on the feet.  feet are of normal color and temp.   1+ bilat leg edema.  There is bilateral onychomycosis of the toenails PULSES: dorsalis pedis intact bilat.  no carotid bruit NEURO:  cn 2-12 grossly intact.   readily moves all 4's.  sensation is intact to touch on the feet SKIN:  Normal texture and temperature.  No rash or suspicious lesion is visible.   NODES:  None palpable at the neck.   PSYCH: alert,  well-oriented.  Does not appear anxious nor depressed.    Lab Results  Component Value Date   HGBA1C 8.0 (H) 02/19/2018   Lab Results  Component Value Date   CREATININE 0.68 02/19/2018   BUN 12 02/19/2018   NA 139 02/19/2018   K 4.4 02/19/2018   CL 105 02/19/2018   CO2 27 02/19/2018   Lab Results  Component Value Date   TSH 0.42 12/04/2017    I have reviewed outside records, and summarized: Pt was noted to have elevated a1c, and referred here.  Other probs addressed were sob, anxiety, and ankle pain.       Assessment & Plan:  Insulin-requiring type 2 DM: she needs increased rx. injection site reactions, new to me, prob due to bydureon Pancreatitis: as this was due to to GB, and was remote, it does not contraindicate GLP med Thyroid mass, new to me  Patient Instructions  I agree with the plan for the thyroid ultrasound.   If you have lumps, the next step would be a nuclear medicine scan.   good diet and exercise significantly improve the control of your diabetes.  please let me know if you wish to be referred to a dietician.  high blood sugar is very risky to your health.  you should see an eye doctor and dentist every year.  It is very important to get all recommended vaccinations.  Controlling your blood pressure and cholesterol drastically reduces the damage diabetes does to your body.  Those who smoke should quit.  Please discuss these with your doctor.  check your blood sugar once a day.  vary the time of day when you check, between before the 3 meals, and at bedtime.  also check if you have symptoms of your blood sugar  being too high or too low.  please keep a record of the readings and bring it to your next appointment here (or you can bring the meter itself).  You can write it on any piece of paper.  please call us sooner if your blood sugar goes below 70, or if you have a lot of readings over 200.  I have sent a prescription to your pharmacy, to change bydureon to "Ozempic." I have also sent a prescription to your pharmacy, to add "Jardiance." Please come back for a follow-up appointment in 1 month.    Bariatric Surgery You have so much to gain by losing weight.  You may have already tried every diet and exercise plan imaginable.  And, you may have sought advice from your family physician, too.   Sometimes, in spite of such diligent efforts, you may not be able to achieve long-term results by yourself.  In cases of severe obesity, bariatric or weight loss surgery is a proven method of achieving long-term weight control.  Our Services Our bariatric surgery programs offer our patients new hope and long-term weight-loss solution.  Since introducing our services in 2003, we have conducted more than 2,400 successful procedures.  Our program is designated as a Investment banker, corporate by the Metabolic and Bariatric Surgery Accreditation and Quality Improvement Program (MBSAQIP), a Child psychotherapist that sets rigorous patient safety and outcome standards.  Our program is also designated as a Engineer, manufacturing systems by [employer].   Our exceptional weight-loss surgery team specializes in diagnosis, treatment, follow-up care, and ongoing support for our patients with severe weight loss challenges.  We currently offer laparoscopic sleeve gastrectomy, gastric bypass, and adjustable gastric band (LAP-BAND).  Attend our Bariatrics Seminar Choosing to undergo a bariatric procedure is a big decision, and one that should not be taken lightly.  You now have two options in how you learn about weight-loss surgery  - in person or online.  Our objective is to ensure you have all of the information that you need to evaluate the advantages and obligations of this life changing procedure.  Please note that you are not alone in this process, and our experienced team is ready to assist and answer all of your questions.  There are several ways to register for a seminar (either on-line or in person): 1)  Call [phone] 2) Go on-line to [facility] and register for either type of seminar.  [email or web address]

## 2018-02-24 NOTE — Patient Instructions (Addendum)
I agree with the plan for the thyroid ultrasound.   If you have lumps, the next step would be a nuclear medicine scan.   good diet and exercise significantly improve the control of your diabetes.  please let me know if you wish to be referred to a dietician.  high blood sugar is very risky to your health.  you should see an eye doctor and dentist every year.  It is very important to get all recommended vaccinations.  Controlling your blood pressure and cholesterol drastically reduces the damage diabetes does to your body.  Those who smoke should quit.  Please discuss these with your doctor.  check your blood sugar once a day.  vary the time of day when you check, between before the 3 meals, and at bedtime.  also check if you have symptoms of your blood sugar being too high or too low.  please keep a record of the readings and bring it to your next appointment here (or you can bring the meter itself).  You can write it on any piece of paper.  please call us sooner if your blood sugar goes below 70, or if you have a lot of readings over 200.  I have sent a prescription to your pharmacy, to change bydureon to "Ozempic." I have also sent a prescription to your pharmacy, to add "Jardiance." Please come back for a follow-up appointment in 1 month.    Bariatric Surgery You have so much to gain by losing weight.  You may have already tried every diet and exercise plan imaginable.  And, you may have sought advice from your family physician, too.   Sometimes, in spite of such diligent efforts, you may not be able to achieve long-term results by yourself.  In cases of severe obesity, bariatric or weight loss surgery is a proven method of achieving long-term weight control.  Our Services Our bariatric surgery programs offer our patients new hope and long-term weight-loss solution.  Since introducing our services in 2003, we have conducted more than 2,400 successful procedures.  Our program is designated as a  Investment banker, corporate by the Metabolic and Bariatric Surgery Accreditation and Quality Improvement Program (MBSAQIP), a Child psychotherapist that sets rigorous patient safety and outcome standards.  Our program is also designated as a Engineer, manufacturing systems by [employer].   Our exceptional weight-loss surgery team specializes in diagnosis, treatment, follow-up care, and ongoing support for our patients with severe weight loss challenges.  We currently offer laparoscopic sleeve gastrectomy, gastric bypass, and adjustable gastric band (LAP-BAND).    Attend our Bariatrics Seminar Choosing to undergo a bariatric procedure is a big decision, and one that should not be taken lightly.  You now have two options in how you learn about weight-loss surgery - in person or online.  Our objective is to ensure you have all of the information that you need to evaluate the advantages and obligations of this life changing procedure.  Please note that you are not alone in this process, and our experienced team is ready to assist and answer all of your questions.  There are several ways to register for a seminar (either on-line or in person): 1)  Call [phone] 2) Go on-line to [facility] and register for either type of seminar.  [email or web address]

## 2018-02-27 DIAGNOSIS — E119 Type 2 diabetes mellitus without complications: Secondary | ICD-10-CM | POA: Insufficient documentation

## 2018-03-01 ENCOUNTER — Telehealth: Payer: Self-pay

## 2018-03-01 NOTE — Telephone Encounter (Signed)
Cover my Meds [id number]; PA Case ID [id number]; Rx [id number]. Received notification that PA for [place] Pepper has been approved effective 02/26/18. The authorization is effective for a maximum of 12 fills from 02-27-19 through 02-26-19, as long as the member is enrolled in their current plan.

## 2018-04-02 ENCOUNTER — Ambulatory Visit: Payer: 59 | Admitting: Endocrinology

## 2018-04-02 DIAGNOSIS — Z0289 Encounter for other administrative examinations: Secondary | ICD-10-CM

## 2018-05-05 ENCOUNTER — Ambulatory Visit: Payer: 59 | Admitting: Obstetrics & Gynecology

## 2018-05-28 MED FILL — JARDIANCE 10 MG TABLET: 10 | 30 days supply | Qty: 30 | Fill #0

## 2018-05-28 MED FILL — MONTELUKAST SOD 10 MG TAB: 10 | 30 days supply | Qty: 30 | Fill #2

## 2018-05-28 MED FILL — KETOCONAZOLE 2% SHAMPOO: 2 | 30 days supply | Qty: 120 | Fill #1

## 2018-05-28 MED FILL — OZEMPIC 1 MG/DOSE SOPN: 2 | 28 days supply | Qty: 3 | Fill #1

## 2018-05-28 MED FILL — DAYSEE 0.15-0.03-0.01 MG TA: 0.15-0.03 & | 91 days supply | Qty: 91 | Fill #2

## 2018-05-28 MED FILL — lamoTRIgine 200 MG TABS: 200 | 30 days supply | Qty: 30 | Fill #4

## 2018-05-28 MED FILL — buPROPion HCL ER (XL) 150 M: 150 | 30 days supply | Qty: 30 | Fill #1

## 2018-07-20 ENCOUNTER — Telehealth: Payer: No Typology Code available for payment source | Admitting: Family

## 2018-07-20 ENCOUNTER — Encounter: Payer: Self-pay | Admitting: Medical

## 2018-07-20 ENCOUNTER — Telehealth: Payer: Self-pay | Admitting: Medical

## 2018-07-20 DIAGNOSIS — J309 Allergic rhinitis, unspecified: Secondary | ICD-10-CM | POA: Diagnosis not present

## 2018-07-20 NOTE — Telephone Encounter (Signed)
Will you ask pt to schedule webex. She wants a return to worknote. Under viral pendemic situation can't give her one unless office visit made documenting what was going on. She is in health care.

## 2018-07-20 NOTE — Progress Notes (Signed)
E visit for Allergic Rhinitis We are sorry that you are not feeling well.  Here is how we plan to help!  I understand needing a day off, I will send you a work note through your my chart. Hope you get some relief from your allergies and lower back pain.  Approximately 5 minutes was spent documenting and reviewing patient's chart.   Based on what you have shared with me it looks like you have Allergic Rhinitis.  Rhinitis is when a reaction occurs that causes nasal congestion, runny nose, sneezing, and itching.  Most types of rhinitis are caused by an inflammation and are associated with symptoms in the eyes ears or throat. There are several types of rhinitis.  The most common are acute rhinitis, which is usually caused by a viral illness, allergic or seasonal rhinitis, and nonallergic or year-round rhinitis.  Nasal allergies occur certain times of the year.  Allergic rhinitis is caused when allergens in the air trigger the release of histamine in the body.  Histamine causes itching, swelling, and fluid to build up in the fragile linings of the nasal passages, sinuses and eyelids.  An itchy nose and clear discharge are commo  I recommend the following over the counter treatments: You should take a daily dose of antihistamine and Clarinex 5 mg take 1 tablet daily 9Cannot take if pregnant or breastfeeding)  I also would recommend a nasal spray: Flonase 2 sprays into each nostril once daily   HOME CARE:   You can use an over-the-counter saline nasal spray as needed  Avoid areas where there is heavy dust, mites, or molds  Stay indoors on windy days during the pollen season  Keep windows closed in home, at least in bedroom; use air conditioner.  Use high-efficiency house air filter  Keep windows closed in car, turn AC on re-circulate  Avoid playing out with dog during pollen season  GET HELP RIGHT AWAY IF:   If your symptoms do not improve within 10 days  You become short of  breath  You develop yellow or green discharge from your nose for over 3 days  You have coughing fits  MAKE SURE YOU:   Understand these instructions  Will watch your condition  Will get help right away if you are not doing well or get worse  Thank you for choosing an e-visit. Your e-visit answers were reviewed by a board certified advanced clinical practitioner to complete your personal care plan. Depending upon the condition, your plan could have included both over the counter or prescription medications. Please review your pharmacy choice. Be sure that the pharmacy you have chosen is open so that you can pick up your prescription now.  If there is a problem you may message your provider in MyChart to have the prescription routed to another pharmacy. Your safety is important to [place]. If you have drug allergies check your prescription carefully.  For the next 24 hours, you can use MyChart to ask questions about today's visit, request a non-urgent call back, or ask for a work or school excuse from your e-visit provider. You will get an email in the next two days asking about your experience. I hope that your e-visit has been valuable and will speed your recovery.

## 2018-07-21 NOTE — Telephone Encounter (Signed)
LVM for pt to schedule a Webex appt per provider if possible.

## 2018-10-01 ENCOUNTER — Telehealth: Payer: Self-pay

## 2018-10-01 NOTE — Telephone Encounter (Signed)
LOV 02/24/18. Per Dr. Loanne Drilling, f/u in 1 mo. Called pt to reschedule appt. LVM requesting returned call.

## 2018-10-06 ENCOUNTER — Telehealth: Payer: Self-pay

## 2018-10-06 NOTE — Telephone Encounter (Signed)
LOV 02/24/18. Per Dr. Ellison, f/u in 1 mo. Called pt to reschedule appt. LVM requesting returned call. 

## 2018-12-14 ENCOUNTER — Encounter: Payer: Self-pay | Admitting: Medical

## 2018-12-14 ENCOUNTER — Encounter: Payer: Self-pay | Admitting: Endocrinology

## 2018-12-15 NOTE — Telephone Encounter (Signed)
Please advise 

## 2018-12-15 NOTE — Telephone Encounter (Signed)
At Dr. Cordelia Pen request, please call pt to schedule an appt for his next availability to further discuss.

## 2018-12-17 ENCOUNTER — Ambulatory Visit (INDEPENDENT_AMBULATORY_CARE_PROVIDER_SITE_OTHER): Payer: No Typology Code available for payment source | Admitting: Endocrinology

## 2018-12-17 ENCOUNTER — Other Ambulatory Visit: Payer: Self-pay | Admitting: Obstetrics & Gynecology

## 2018-12-17 ENCOUNTER — Other Ambulatory Visit: Payer: Self-pay

## 2018-12-17 ENCOUNTER — Telehealth: Payer: Self-pay

## 2018-12-17 ENCOUNTER — Encounter: Payer: Self-pay | Admitting: Endocrinology

## 2018-12-17 VITALS — BP 124/78 | HR 91 | Ht 65.0 in | Wt 296.4 lb

## 2018-12-17 DIAGNOSIS — E119 Type 2 diabetes mellitus without complications: Secondary | ICD-10-CM | POA: Diagnosis not present

## 2018-12-17 DIAGNOSIS — E049 Nontoxic goiter, unspecified: Secondary | ICD-10-CM

## 2018-12-17 DIAGNOSIS — Z794 Long term (current) use of insulin: Secondary | ICD-10-CM | POA: Diagnosis not present

## 2018-12-17 DIAGNOSIS — Z30011 Encounter for initial prescription of contraceptive pills: Secondary | ICD-10-CM

## 2018-12-17 HISTORY — DX: Nontoxic goiter, unspecified: E04.9

## 2018-12-17 LAB — POCT GLYCOSYLATED HEMOGLOBIN (HGB A1C): Hemoglobin A1C: 9.1 % — AB (ref 4.0–5.6)

## 2018-12-17 LAB — BASIC METABOLIC PANEL
BUN: 10 mg/dL (ref 6–23)
CO2: 25 mEq/L (ref 19–32)
Calcium: 8.6 mg/dL (ref 8.4–10.5)
Chloride: 104 mEq/L (ref 96–112)
Creatinine, Ser: 0.71 mg/dL (ref 0.40–1.20)
GFR: 108.6 mL/min (ref >60.00)
Glucose, Bld: 209 mg/dL — ABNORMAL HIGH (ref 70–99)
Potassium: 4 mEq/L (ref 3.5–5.1)
Sodium: 137 mEq/L (ref 135–145)

## 2018-12-17 LAB — TSH: TSH: 0.66 u[IU]/mL (ref 0.35–4.50)

## 2018-12-17 MED ORDER — METFORMIN HCL 500 MG PO TABS: 500.0000 mg | ORAL_TABLET | Freq: Two times a day (BID) | ORAL | 3 refills | Status: AC

## 2018-12-17 MED ORDER — NOVOLOG FLEXPEN 100 UNIT/ML ~~LOC~~ SOPN: 5.0000 [IU] | PEN_INJECTOR | Freq: Three times a day (TID) | SUBCUTANEOUS | 2 refills | Status: DC

## 2018-12-17 MED ORDER — "PEN NEEDLES 5/16"" 30G X 8 MM MISC": 1.0000 | Freq: Three times a day (TID) | 2 refills | Status: DC

## 2018-12-17 MED ORDER — INSULIN LISPRO (1 UNIT DIAL) 100 UNIT/ML (KWIKPEN): 5.0000 [IU] | PEN_INJECTOR | Freq: Three times a day (TID) | SUBCUTANEOUS | 11 refills | Status: DC

## 2018-12-17 MED ORDER — OZEMPIC (1 MG/DOSE) 2 MG/1.5ML ~~LOC~~ SOPN: 1.0000 mg | PEN_INJECTOR | SUBCUTANEOUS | 11 refills | Status: DC

## 2018-12-17 MED ORDER — JARDIANCE 10 MG PO TABS: 10.0000 mg | ORAL_TABLET | Freq: Every day | ORAL | 3 refills | Status: AC

## 2018-12-17 MED ORDER — ACCU-CHEK GUIDE VI STRP: 1.0000 | ORAL_STRIP | Freq: Two times a day (BID) | 3 refills | Status: DC

## 2018-12-17 MED FILL — JARDIANCE 10 MG TABLET: 10 | 90 days supply | Qty: 90 | Fill #0

## 2018-12-17 MED FILL — BD PEN NDL SHORT 31GX5/16": 31G X 8 MM | 33 days supply | Qty: 100 | Fill #0

## 2018-12-17 MED FILL — OZEMPIC 1 MG/DOSE SOPN: 2 | 28 days supply | Qty: 3 | Fill #0

## 2018-12-17 MED FILL — metFORMIN HCL 500 MG TABS: 500 | 90 days supply | Qty: 180 | Fill #0

## 2018-12-17 MED FILL — NOVOLOG FLEXPEN SYRINGE: 100 | 80 days supply | Qty: 12 | Fill #0

## 2018-12-17 MED FILL — BD PEN NDL SHORT 31GX5/16: 31G X 8 MM | 33 days supply | Qty: 100 | Fill #0

## 2018-12-17 MED FILL — ACCU-CHEK GUIDE TEST STRIP: 50 days supply | Qty: 100 | Fill #0

## 2018-12-17 NOTE — Telephone Encounter (Signed)
Ok, I finished the note

## 2018-12-17 NOTE — Progress Notes (Signed)
Subjective:    Patient ID: Katie Luna, female    DOB: [birth year], 43 y.o.   MRN: [id number]  HPI Pt returns for f/u of diabetes mellitus: DM type: Insulin-requiring type 2 Dx'ed: 8338 Complications: none Therapy: insulin since 2018, Ozempic, and 2 oral meds GDM: 1998 DKA: never Severe hypoglycemia: once, in 2004.  Pancreatitis: several episode of GB pancreatitis (none since cholecystect in 1998) Pancreatic imaging: no result available to [place] Other: she did not tolerate Bydureon (knows at injections sites); she cannot take pioglitazone, due to edema Interval history: no cbg record, but states cbg's vary from 200-400.  She takes meds intermittently.  She takes humalog PRN.   Past Medical History:  Diagnosis Date  . Anemia   . Diabetes mellitus without complication ([place])   . GERD (gastroesophageal reflux disease)   . Goiter 12/17/2018  . Polycystic ovary syndrome     Past Surgical History:  Procedure Laterality Date  . CHOLECYSTECTOMY      Social History   Socioeconomic History  . Marital status: Married    Spouse name: Not on file  . Number of children: 1  . Years of education: LPN  . Highest education level: Not on file  Occupational History  . Not on file  Social Needs  . Financial resource strain: Not on file  . Food insecurity    Worry: Not on file    Inability: Not on file  . Transportation needs    Medical: Not on file    Non-medical: Not on file  Tobacco Use  . Smoking status: Former Smoker    Years: 29.00    Types: Cigarettes    Quit date: 10/19/2017    Years since quitting: 1.1  . Smokeless tobacco: Never Used  Substance and Sexual Activity  . Alcohol use: Yes    Comment: very rare infrequent. occaisional glass of wine.  . Drug use: No  . Sexual activity: Yes  Lifestyle  . Physical activity    Days per week: 3 days    Minutes per session: 60 min  . Stress: Very much  Relationships  . Social Herbalist on phone: Not on file   Gets together: Not on file    Attends religious service: Not on file    Active member of club or organization: Not on file    Attends meetings of clubs or organizations: Not on file    Relationship status: Not on file  . Intimate partner violence    Fear of current or ex partner: Not on file    Emotionally abused: Not on file    Physically abused: Not on file    Forced sexual activity: Not on file  Other Topics Concern  . Not on file  Social History Narrative  . Not on file    Current Outpatient Medications on File Prior to Visit  Medication Sig Dispense Refill  . albuterol (PROVENTIL) (2.5 MG/3ML) 0.083% nebulizer solution Take 3 mLs (2.5 mg total) by nebulization every 6 (six) hours as needed for wheezing or shortness of breath. 150 mL 1  . budesonide-formoterol (SYMBICORT) 160-4.5 MCG/ACT inhaler Inhale 2 puffs into the lungs 2 (two) times daily. 1 Inhaler 3  . buPROPion (WELLBUTRIN XL) 150 MG 24 hr tablet Take 1 tablet (150 mg total) by mouth daily. 30 tablet 1  . fluticasone (FLONASE) 50 MCG/ACT nasal spray Place 2 sprays into both nostrils daily. 16 g 1  . hydrocortisone 2.5 % ointment Apply topically 2 (  two) times daily. 30 g 0  . ipratropium-albuterol (DUONEB) 0.5-2.5 (3) MG/3ML SOLN Take 3 mLs by nebulization every 4 (four) hours as needed. 360 mL 0  . ketoconazole (NIZORAL) 2 % shampoo Apply 1 application topically 2 (two) times a week. 120 mL 1  . lamoTRIgine (LAMICTAL) 200 MG tablet Take 200 mg by mouth daily.   99  . levocetirizine (XYZAL) 5 MG tablet Take 1 tablet (5 mg total) by mouth every evening. 30 tablet 0  . Levonorgestrel-Ethinyl Estradiol (AMETHIA,CAMRESE) 0.15-0.03 &0.01 MG tablet Take 1 tablet by mouth daily. 1 Package 4  . montelukast (SINGULAIR) 10 MG tablet Take 1 tablet (10 mg total) by mouth at bedtime. 30 tablet 3  . pantoprazole (PROTONIX) 40 MG tablet Take 1 tablet (40 mg total) by mouth daily. 30 tablet 3  . phentermine 37.5 MG capsule Take 1 capsule  (37.5 mg total) by mouth every morning. 30 capsule 0  . Spacer/Aero-Holding Chambers (AEROCHAMBER MV) inhaler Use as instructed 1 each 2  . VENTOLIN HFA 108 (90 Base) MCG/ACT inhaler   0   No current facility-administered medications on file prior to visit.     Allergies  Allergen Reactions  . Other Anaphylaxis  . Shellfish Allergy Swelling    Family History  Problem Relation Age of Onset  . Diabetes Mother   . Cancer Mother   . Sickle cell anemia Father   . Alcohol abuse Father   . Colon cancer Father   . Diabetes Maternal Grandfather     BP 124/78 (BP Location: Left Wrist, Patient Position: Sitting, Cuff Size: Large)   Pulse 91   Ht 5\' 5"  (1.651 m)   Wt 296 lb 6.4 oz (134.4 kg)   SpO2 97%   BMI 49.32 kg/m    Review of Systems Denies weight change.      Objective:   Physical Exam VITAL SIGNS:  See vs page GENERAL: no distress Pulses: dorsalis pedis intact bilat.   MSK: no deformity of the feet CV: trace bilat leg edema Skin:  no ulcer on the feet.  normal color and temp on the feet. Neuro: sensation is intact to touch on the feet Ext: there is bilateral onychomycosis of the toenails   A1c=9.1%    Assessment & Plan:  Thyroid mass.  W/u needed. Insulin-requiring type 2 DM: worse Noncompliance with cbg recording, meds, and f/u ov's: I'll work around this as best I can.  Edema: This limits rx.    Patient Instructions  Let's check the ultrasound.  you will receive a phone call, about a day and time for an appointment. If you have lumps, the next step would be a nuclear medicine scan.   check your blood sugar once a day.  vary the time of day when you check, between before the 3 meals, and at bedtime.  also check if you have symptoms of your blood sugar being too high or too low.  please keep a record of the readings and bring it to your next appointment here (or you can bring the meter itself).  You can write it on any piece of paper.  please call us sooner if  your blood sugar goes below 70, or if you have a lot of readings over 200.  We can add other meds if necessary.  I have sent prescriptions to your pharmacy, to refill your meds.   Please come back for a follow-up appointment in 2 months.

## 2018-12-17 NOTE — Telephone Encounter (Signed)
Attempted to complete PA for Ozempic through Cover My Meds. Received notification to call insurance company to complete. Called Member Services at [phone]. Spoke with Tori. States they do not take clinical information over the phone NOR have a form to complete. Asked the following be faxed:  - Most recent office note that includes ALL tried and failed therapies as well as the responses to the failed therapies and most recent A1C.  Must be faxed to [phone].   This message being routed to Dr. Loanne Drilling for him to complete his note as well as to ensure above information is included in note. Will fax once his office note is completed.

## 2018-12-17 NOTE — Patient Instructions (Addendum)
Let's check the ultrasound.  you will receive a phone call, about a day and time for an appointment. If you have lumps, the next step would be a nuclear medicine scan.   check your blood sugar once a day.  vary the time of day when you check, between before the 3 meals, and at bedtime.  also check if you have symptoms of your blood sugar being too high or too low.  please keep a record of the readings and bring it to your next appointment here (or you can bring the meter itself).  You can write it on any piece of paper.  please call us sooner if your blood sugar goes below 70, or if you have a lot of readings over 200.  We can add other meds if necessary.  I have sent prescriptions to your pharmacy, to refill your meds.   Please come back for a follow-up appointment in 2 months.

## 2018-12-20 NOTE — Telephone Encounter (Signed)
Office notes and recent A1C faxed to [place] @[phone]. Will await insurance response re: approval/denial.

## 2018-12-24 NOTE — Telephone Encounter (Signed)
Called [employer] and spoke with Tori re: PA for Ozempic. States Ozempic has been approved 12/20/18 through 12/20/19. Asked if a letter will be faxed to our office with this information. States she will be faxing today or tomorrow.

## 2018-12-28 MED FILL — lamoTRIgine 200 MG TABS: 200 | 30 days supply | Qty: 30 | Fill #0

## 2019-02-18 ENCOUNTER — Ambulatory Visit (INDEPENDENT_AMBULATORY_CARE_PROVIDER_SITE_OTHER): Payer: No Typology Code available for payment source | Admitting: Endocrinology

## 2019-02-18 ENCOUNTER — Other Ambulatory Visit: Payer: Self-pay

## 2019-02-18 ENCOUNTER — Encounter: Payer: Self-pay | Admitting: Endocrinology

## 2019-02-18 VITALS — BP 114/72 | HR 93 | Ht 65.0 in | Wt 288.4 lb

## 2019-02-18 DIAGNOSIS — E119 Type 2 diabetes mellitus without complications: Secondary | ICD-10-CM | POA: Diagnosis not present

## 2019-02-18 DIAGNOSIS — Z794 Long term (current) use of insulin: Secondary | ICD-10-CM | POA: Diagnosis not present

## 2019-02-18 LAB — POCT GLYCOSYLATED HEMOGLOBIN (HGB A1C): Hemoglobin A1C: 7.3 % — AB (ref 4.0–5.6)

## 2019-02-18 MED ORDER — REPAGLINIDE 1 MG PO TABS: 1.0000 mg | ORAL_TABLET | Freq: Three times a day (TID) | ORAL | 3 refills | Status: DC

## 2019-02-18 MED FILL — REPAGLINIDE 1 MG TABLET: 1 | 90 days supply | Qty: 270 | Fill #0

## 2019-02-18 NOTE — Patient Instructions (Addendum)
I have sent a prescription to your pharmacy, to change the insulin to repaglinide, and: Please continue the same other diabetes medications. check your blood sugar once a day.  vary the time of day when you check, between before the 3 meals, and at bedtime.  also check if you have symptoms of your blood sugar being too high or too low.  please keep a record of the readings and bring it to your next appointment here (or you can bring the meter itself).  You can write it on any piece of paper.  please call us sooner if your blood sugar goes below 70, or if you have a lot of readings over 200. Please come back for a follow-up appointment in 2-3 months.

## 2019-02-18 NOTE — Progress Notes (Signed)
Subjective:    Patient ID: Katie Luna, female    DOB: 05-14-1975, 43 y.o.   MRN: [id number]  HPI Pt returns for f/u of diabetes mellitus: DM type: Insulin-requiring type 2 Dx'ed: 2004 Complications: none Therapy: insulin since 2018, Ozempic, and 2 oral meds.   GDM: 1998 DKA: never Severe hypoglycemia: once, in 2004.  Pancreatitis: several episode of GB pancreatitis (none since cholecystect in 1998).   Pancreatic imaging: no result available to [place] Other: she did not tolerate Bydureon (nodules at injections sites); she cannot take pioglitazone, due to edema; she takes multiple daily injections.    Interval history: no cbg record, but states cbg's vary from 120-400.  She seldom misses meds.  Past Medical History:  Diagnosis Date  . Anemia   . Diabetes mellitus without complication ([facility])   . GERD (gastroesophageal reflux disease)   . Goiter 12/17/2018  . Polycystic ovary syndrome     Past Surgical History:  Procedure Laterality Date  . CHOLECYSTECTOMY      Social History   Socioeconomic History  . Marital status: Married    Spouse name: Not on file  . Number of children: 1  . Years of education: LPN  . Highest education level: Not on file  Occupational History  . Not on file  Social Needs  . Financial resource strain: Not on file  . Food insecurity    Worry: Not on file    Inability: Not on file  . Transportation needs    Medical: Not on file    Non-medical: Not on file  Tobacco Use  . Smoking status: Former Smoker    Years: 29.00    Types: Cigarettes    Quit date: 10/19/2017    Years since quitting: 1.3  . Smokeless tobacco: Never Used  Substance and Sexual Activity  . Alcohol use: Yes    Comment: very rare infrequent. occaisional glass of wine.  . Drug use: No  . Sexual activity: Yes  Lifestyle  . Physical activity    Days per week: 3 days    Minutes per session: 60 min  . Stress: Very much  Relationships  . Social Musician on phone:  Not on file    Gets together: Not on file    Attends religious service: Not on file    Active member of club or organization: Not on file    Attends meetings of clubs or organizations: Not on file    Relationship status: Not on file  . Intimate partner violence    Fear of current or ex partner: Not on file    Emotionally abused: Not on file    Physically abused: Not on file    Forced sexual activity: Not on file  Other Topics Concern  . Not on file  Social History Narrative  . Not on file    Current Outpatient Medications on File Prior to Visit  Medication Sig Dispense Refill  . albuterol (PROVENTIL) (2.5 MG/3ML) 0.083% nebulizer solution Take 3 mLs (2.5 mg total) by nebulization every 6 (six) hours as needed for wheezing or shortness of breath. 150 mL 1  . budesonide-formoterol (SYMBICORT) 160-4.5 MCG/ACT inhaler Inhale 2 puffs into the lungs 2 (two) times daily. 1 Inhaler 3  . buPROPion (WELLBUTRIN XL) 150 MG 24 hr tablet Take 1 tablet (150 mg total) by mouth daily. 30 tablet 1  . empagliflozin (JARDIANCE) 10 MG TABS tablet Take 10 mg by mouth daily. 90 tablet 3  .  fluticasone (FLONASE) 50 MCG/ACT nasal spray Place 2 sprays into both nostrils daily. 16 g 1  . glucose blood (ACCU-CHEK GUIDE) test strip 1 each by Other route 2 (two) times daily. And lancets 2/day 200 each 3  . hydrocortisone 2.5 % ointment Apply topically 2 (two) times daily. 30 g 0  . ipratropium-albuterol (DUONEB) 0.5-2.5 (3) MG/3ML SOLN Take 3 mLs by nebulization every 4 (four) hours as needed. 360 mL 0  . ketoconazole (NIZORAL) 2 % shampoo Apply 1 application topically 2 (two) times a week. 120 mL 1  . lamoTRIgine (LAMICTAL) 200 MG tablet Take 200 mg by mouth daily.   99  . levocetirizine (XYZAL) 5 MG tablet Take 1 tablet (5 mg total) by mouth every evening. 30 tablet 0  . Levonorgestrel-Ethinyl Estradiol (AMETHIA,CAMRESE) 0.15-0.03 &0.01 MG tablet Take 1 tablet by mouth daily. 1 Package 4  . metFORMIN (GLUCOPHAGE)  500 MG tablet Take 1 tablet (500 mg total) by mouth 2 (two) times daily with a meal. 180 tablet 3  . montelukast (SINGULAIR) 10 MG tablet Take 1 tablet (10 mg total) by mouth at bedtime. 30 tablet 3  . pantoprazole (PROTONIX) 40 MG tablet Take 1 tablet (40 mg total) by mouth daily. 30 tablet 3  . phentermine 37.5 MG capsule Take 1 capsule (37.5 mg total) by mouth every morning. 30 capsule 0  . Semaglutide, 1 MG/DOSE, (OZEMPIC, 1 MG/DOSE,) 2 MG/1.5ML SOPN Inject 1 mg into the skin once a week. 4 pen 11  . Spacer/Aero-Holding Chambers (AEROCHAMBER MV) inhaler Use as instructed 1 each 2  . VENTOLIN HFA 108 (90 Base) MCG/ACT inhaler   0   No current facility-administered medications on file prior to visit.     Allergies  Allergen Reactions  . Other Anaphylaxis  . Shellfish Allergy Swelling    Family History  Problem Relation Age of Onset  . Diabetes Mother   . Cancer Mother   . Sickle cell anemia Father   . Alcohol abuse Father   . Colon cancer Father   . Diabetes Maternal Grandfather     BP 114/72 (BP Location: Left Arm, Patient Position: Sitting, Cuff Size: Large)   Pulse 93   Ht 5\' 5"  (1.651 m)   Wt 288 lb 6.4 oz (130.8 kg)   SpO2 99%   BMI 47.99 kg/m    Review of Systems She denies hypoglycemia.      Objective:   Physical Exam VITAL SIGNS:  See vs page GENERAL: no distress Pulses: dorsalis pedis intact bilat.   MSK: no deformity of the feet CV: 1+ bilat leg edema Skin:  no ulcer on the feet.  normal color and temp on the feet. Neuro: sensation is intact to touch on the feet Ext: there is bilateral onychomycosis of the toenails  Lab Results  Component Value Date   HGBA1C 7.3 (A) 02/18/2019   Lab Results  Component Value Date   CREATININE 0.71 12/17/2018   BUN 10 12/17/2018   NA 137 12/17/2018   K 4.0 12/17/2018   CL 104 12/17/2018   CO2 25 12/17/2018        Assessment & Plan:  Type 2 DM: she is ready to d/c insulin Edema: This limits rx options    Patient Instructions  I have sent a prescription to your pharmacy, to change the insulin to repaglinide, and: Please continue the same other diabetes medications. check your blood sugar once a day.  vary the time of day when you check, between before the  3 meals, and at bedtime.  also check if you have symptoms of your blood sugar being too high or too low.  please keep a record of the readings and bring it to your next appointment here (or you can bring the meter itself).  You can write it on any piece of paper.  please call us sooner if your blood sugar goes below 70, or if you have a lot of readings over 200. Please come back for a follow-up appointment in 2-3 months.

## 2019-02-23 ENCOUNTER — Ambulatory Visit: Payer: No Typology Code available for payment source | Admitting: Medical

## 2019-02-24 MED FILL — JAIMIESS 0.15-0.03 &0.01 MG: 0.15-0.03 & | 91 days supply | Qty: 91 | Fill #0

## 2019-04-02 IMAGING — DX DG CHEST 2V
2 series · 2 of 2 positions shown · non-contrast
Comparison: 08/14/2017

CLINICAL DATA: Cough for 2 months. Wheezing. History of smoking and
asthma.

EXAM:
CHEST - 2 VIEW

[chest pa]
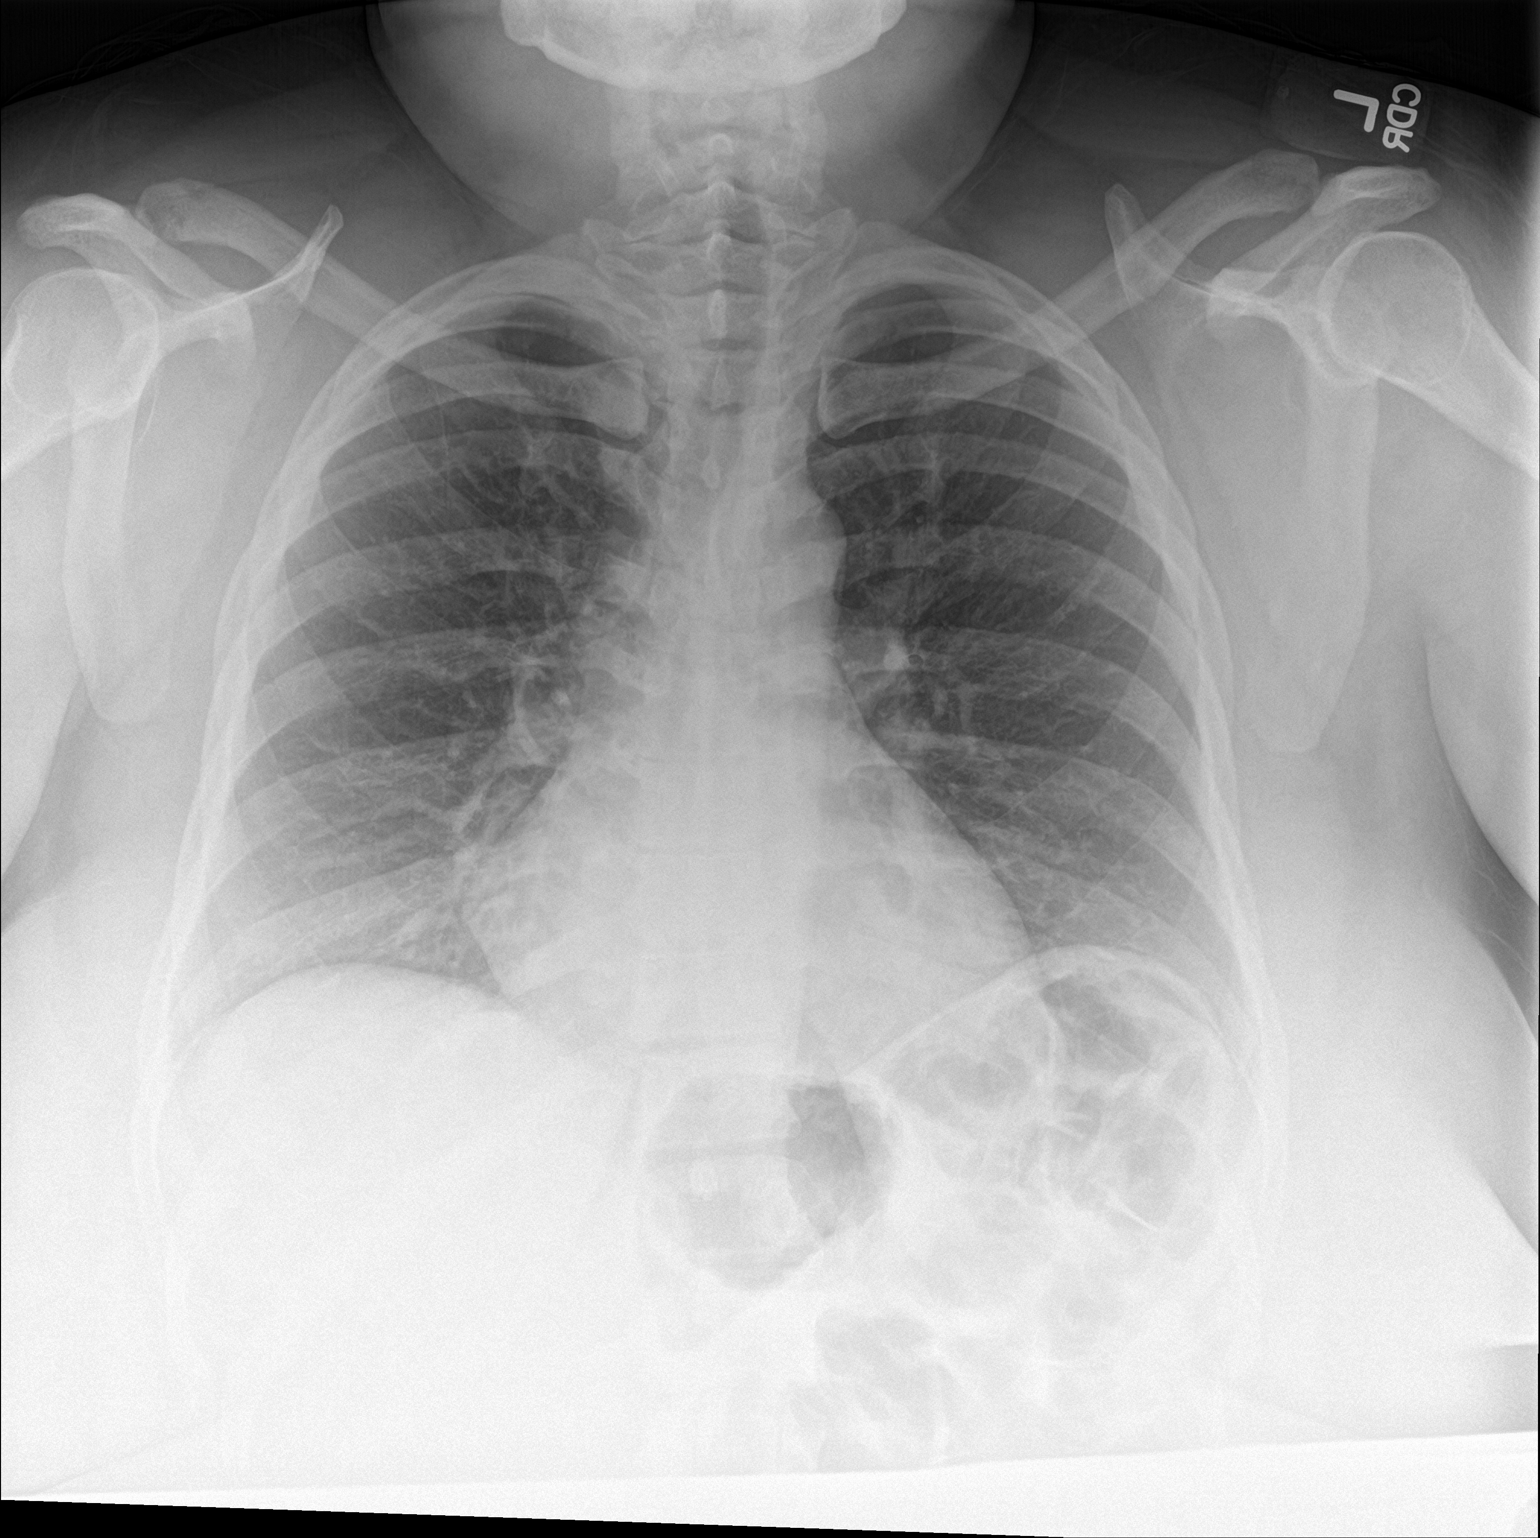

[chest lat]
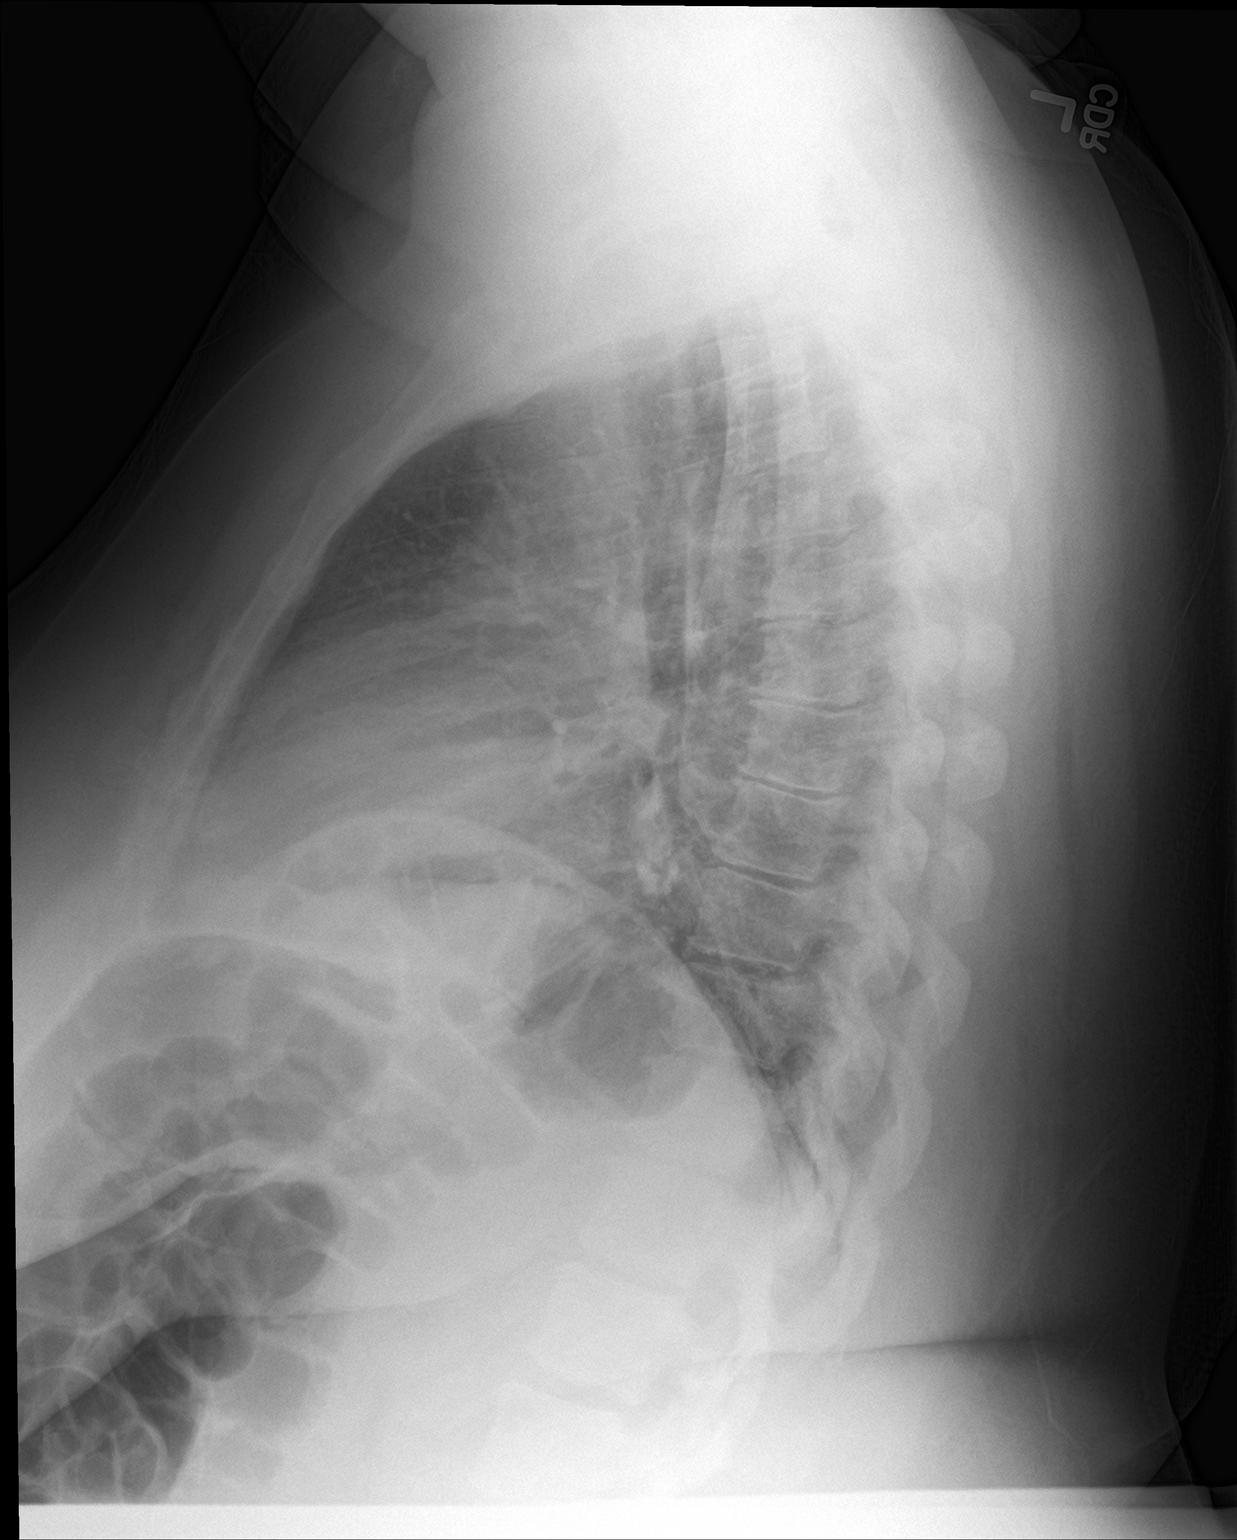

[2 of 2 positions shown; findings below may reference images not displayed]

FINDINGS: The cardiac silhouette is upper limits of normal in size. The lungs
are mildly hypoinflated, and there is mild peribronchial thickening.
No confluent airspace opacity, overt pulmonary edema, pleural
effusion, pneumothorax is identified. No acute osseous abnormality
is seen.
IMPRESSION: Mild peribronchial thickening, which may reflect bronchitis or
reactive airways disease.

## 2019-04-26 ENCOUNTER — Ambulatory Visit: Payer: No Typology Code available for payment source | Admitting: Endocrinology

## 2019-05-03 ENCOUNTER — Ambulatory Visit: Payer: No Typology Code available for payment source | Admitting: Podiatry

## 2019-05-03 ENCOUNTER — Ambulatory Visit (INDEPENDENT_AMBULATORY_CARE_PROVIDER_SITE_OTHER): Payer: No Typology Code available for payment source

## 2019-05-03 ENCOUNTER — Encounter: Payer: Self-pay | Admitting: Podiatry

## 2019-05-03 ENCOUNTER — Other Ambulatory Visit: Payer: Self-pay

## 2019-05-03 ENCOUNTER — Telehealth: Payer: Self-pay

## 2019-05-03 DIAGNOSIS — G8929 Other chronic pain: Secondary | ICD-10-CM | POA: Diagnosis not present

## 2019-05-03 DIAGNOSIS — M25571 Pain in right ankle and joints of right foot: Secondary | ICD-10-CM

## 2019-05-03 DIAGNOSIS — M7989 Other specified soft tissue disorders: Secondary | ICD-10-CM | POA: Diagnosis not present

## 2019-05-03 DIAGNOSIS — M799 Soft tissue disorder, unspecified: Secondary | ICD-10-CM | POA: Diagnosis not present

## 2019-05-03 DIAGNOSIS — I739 Peripheral vascular disease, unspecified: Secondary | ICD-10-CM

## 2019-05-03 DIAGNOSIS — M6740 Ganglion, unspecified site: Secondary | ICD-10-CM | POA: Diagnosis not present

## 2019-05-03 MED ORDER — MELOXICAM 15 MG PO TABS: 15.0000 mg | ORAL_TABLET | Freq: Every day | ORAL | 0 refills | Status: AC

## 2019-05-03 MED FILL — MELOXICAM 15 MG TABLET: 15 | 14 days supply | Qty: 14 | Fill #0

## 2019-05-03 NOTE — Telephone Encounter (Signed)
Needing most recent  OV notes for a PA. Also needing a most recent A1c   Fax number [phone]

## 2019-05-03 NOTE — Telephone Encounter (Signed)
Not familiar with [facility]. They need to send a request for records. We cannot just sent to anyone just because they ask. In addition, what is the request for....medication, device, etc?

## 2019-05-04 ENCOUNTER — Telehealth: Payer: Self-pay | Admitting: *Deleted

## 2019-05-04 DIAGNOSIS — M6740 Ganglion, unspecified site: Secondary | ICD-10-CM

## 2019-05-04 DIAGNOSIS — M25571 Pain in right ankle and joints of right foot: Secondary | ICD-10-CM

## 2019-05-04 DIAGNOSIS — M7989 Other specified soft tissue disorders: Secondary | ICD-10-CM

## 2019-05-04 NOTE — Telephone Encounter (Signed)
-----   Message from Vivi Barrack, DPM sent at 05/03/2019  5:39 PM EST ----- Can you please order an MRI of the right ankle to evaluate soft tissue mass on lateral ankle? Thanks.

## 2019-05-04 NOTE — Telephone Encounter (Signed)
Orders to L. Cox, CMA for pre-cert, faxed to [facility]. 

## 2019-05-05 NOTE — Progress Notes (Signed)
Subjective: 44 year old female presents the office for further continued pain to the ankle as well as persist.  She said the areas become very painful and swollen.  She presents today for secondary point after which I told her that the cyst was draining.  She denies any drainage from the area but is tender.  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  She is diabetic and last A1c was 7.3 but her morning blood sugar today was 347. No recent injury or trauma.   Objective: AAO x3, NAD DP/PT pulses palpable bilaterally, CRT less than 3 seconds There is edema and tenderness on the lateral aspect of the ankle and proximal sinus tarsi, lateral ankle.  This patient with a ganglion cyst versus lipoma.  Is no overlying erythema or warmth.  There is no area pinpoint tenderness.  Flexor, extensor tendons appear to be intact. Right hallux nail is dystrophic, discolored mildly tender the tip of the toe.  No redness or drainage or swelling any signs of infection. No pain with calf compression, swelling, warmth, erythema  Assessment: Soft tissue lesion of the right lateral ankle  Plan: -All treatment options discussed with the patient including all alternatives, risks, complications.  -X-rays obtained reviewed.  Soft tissue swelling lateral aspect of the ankle but no evidence of acute fracture, foreign body or calcifications. -ABIs performed the office to help ensure adequate circulation.  Right was 1.17 left was 1.22. -Discussed aspiration of the cyst today.  Discussed risks and benefits of this.  The area was anesthetized with 3 cc of lidocaine, Marcaine plain after the skin was cleaned with alcohol.  Skin was prepped with Betadine.  I utilized an 18-gauge needle to try to aspirate the cyst.  No fluid was identified.  Bandage was applied and a compression bandage.  Post procedure instructions discussed.  Will order MRI of this area to further evaluate.  Discussed surgical intervention of the MRIs for surgical  planning.  Discussed glucose control prior to surgery. -Debrided right hallux toenail any complications at her request. -Patient encouraged to call the office with any questions, concerns, change in symptoms.   Vivi Barrack DPM

## 2019-05-06 ENCOUNTER — Telehealth: Payer: Self-pay | Admitting: *Deleted

## 2019-05-06 NOTE — Telephone Encounter (Signed)
Called and spoke with [place] from [place] and the phone number is [phone] and the representative stated that there was no prior authorization needed and the reference number is [id number] and was based on medical necessity. Misty Stanley

## 2019-05-10 NOTE — Telephone Encounter (Signed)
Durene Cal  pharmactisit [place] [employer]   Calling in  requesting OV notes for this patient for PA    [phone]  Fax number [phone]

## 2019-05-10 NOTE — Telephone Encounter (Signed)
Please refer to previously documented note re: records requests. No further action will be taken as a request needs to be faxed as previously documented below.

## 2019-05-13 ENCOUNTER — Telehealth: Payer: Self-pay

## 2019-05-13 NOTE — Telephone Encounter (Signed)
[facility] sent notification that a PA is required for Jardiance. Asking for supporting documentation, including recent office notes to complete PA. However, pt is overdue for an appt. Therefore cannot supply current office notes to support PA for Jardiance. Returned to [employer] advising that pt will need to call the office to schedule an appt. PA remains on hold until pt schedules an appt. Confirmation of above faxed info received. Documents and fax confirmation have been placed in the faxed file for future reference.

## 2019-05-13 NOTE — Telephone Encounter (Signed)
appt is needed

## 2019-05-13 NOTE — Telephone Encounter (Signed)
Tory calling from insurance plan requesting PA be done for Jardiance-notes state patient needs a f/u but she is just requesting the October notes from when the patient was seen our office so that there is no further gap in therapy please advise if this can be done and contact Tory at   [phone] when this has been completed

## 2019-05-13 NOTE — Telephone Encounter (Signed)
Please advise if you would like to proceed with PA without an appt. To date, pt still has not yet schedule an appt. Uncertain if she will remain on Jardiance, if Jardiance should be changed to a covered alternative, etc.

## 2019-05-13 NOTE — Telephone Encounter (Signed)
Unfortunately, per Dr. Everardo All, requested records to complete PA cannot be sent without an appt.

## 2019-05-17 ENCOUNTER — Telehealth: Payer: Self-pay | Admitting: *Deleted

## 2019-05-17 DIAGNOSIS — M25571 Pain in right ankle and joints of right foot: Secondary | ICD-10-CM

## 2019-05-17 DIAGNOSIS — M7989 Other specified soft tissue disorders: Secondary | ICD-10-CM

## 2019-05-17 DIAGNOSIS — M6740 Ganglion, unspecified site: Secondary | ICD-10-CM

## 2019-05-17 NOTE — Telephone Encounter (Signed)
[facility] - Elease Hashimoto states pt has shellfish allergy but there are no shellfish components in the MRI contrast, will need reorder as with and without.

## 2019-05-18 NOTE — Telephone Encounter (Signed)
LMTCB to schedule.

## 2019-05-20 NOTE — Telephone Encounter (Signed)
LMTCB

## 2019-05-25 ENCOUNTER — Other Ambulatory Visit: Payer: Self-pay | Admitting: Podiatry

## 2019-05-25 DIAGNOSIS — M799 Soft tissue disorder, unspecified: Secondary | ICD-10-CM

## 2019-06-01 IMAGING — CT CT CHEST HIGH RESOLUTION W/O CM
2 of 5 series · 15 of 36 positions shown, 18 images · non-contrast
Comparison: No priors.

CLINICAL DATA: 42-year-old female with history of persistent cough
since June 2017. History of pneumonia 2 times in the past. Former
smoker.

EXAM:
CT CHEST WITHOUT CONTRAST
TECHNIQUE: Multidetector CT imaging of the chest was performed following the
standard protocol without intravenous contrast. High resolution
imaging of the lungs, as well as inspiratory and expiratory imaging,
was performed.

[Series 2: thorax · axial · 0.63mm/px · z∈[-342,-76]mm · 12 of 147 slices shown, 15 images]
[im 7/147  mediastinal]
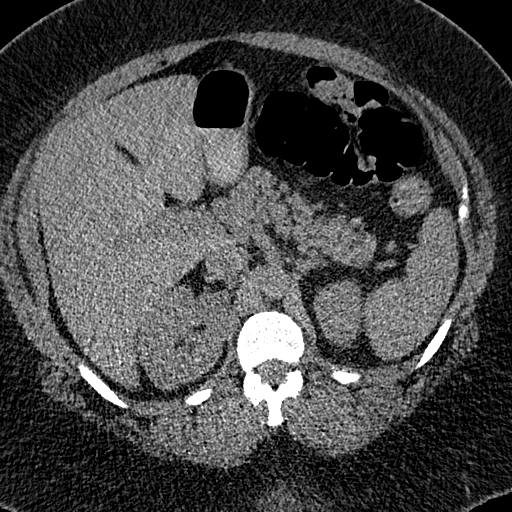
[im 7/147  lung]
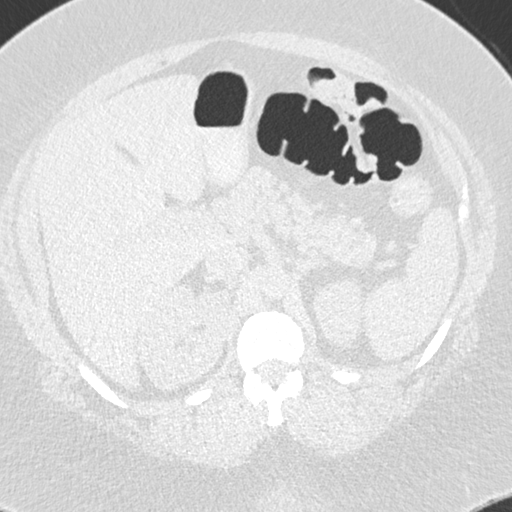
[im 21/147  lung]
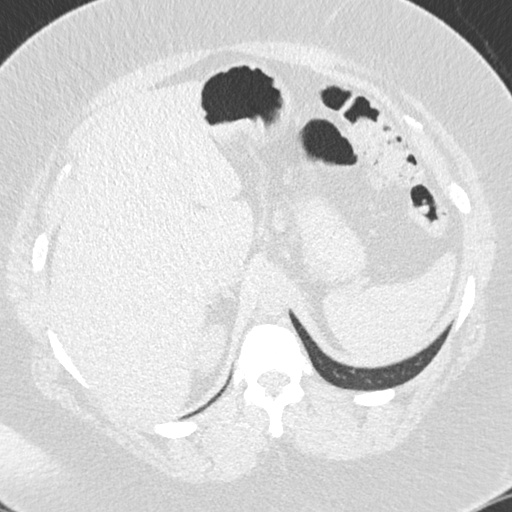
[im 35/147  lung]
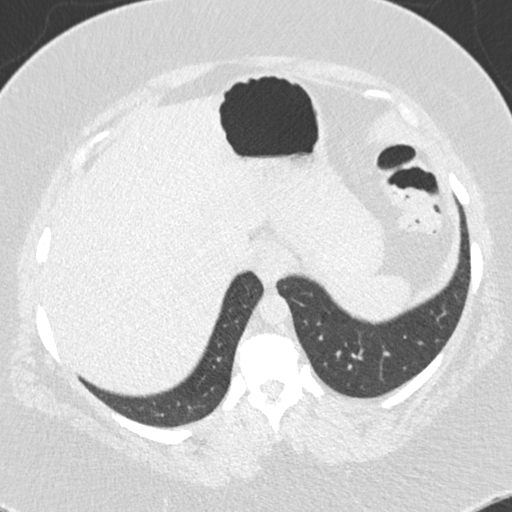
[im 42/147  lung]
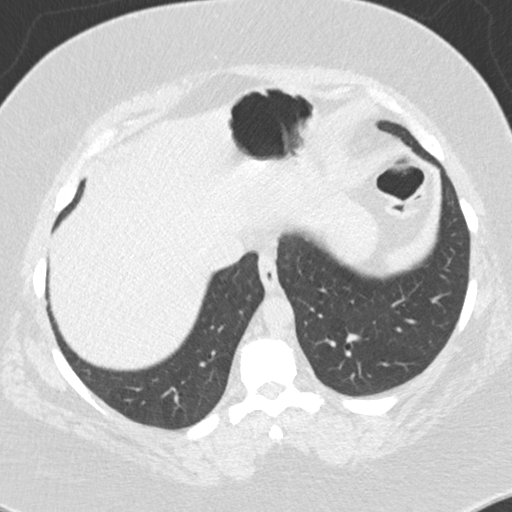
[im 56/147  mediastinal]
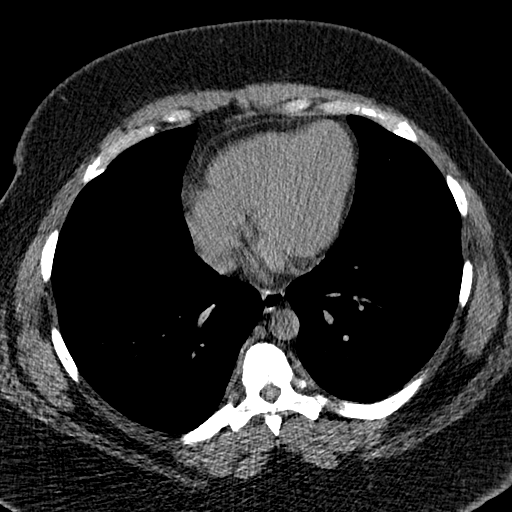
[im 56/147  lung]
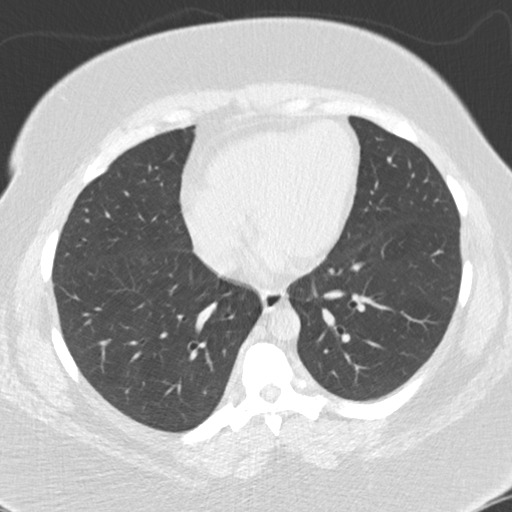
[im 70/147  lung]
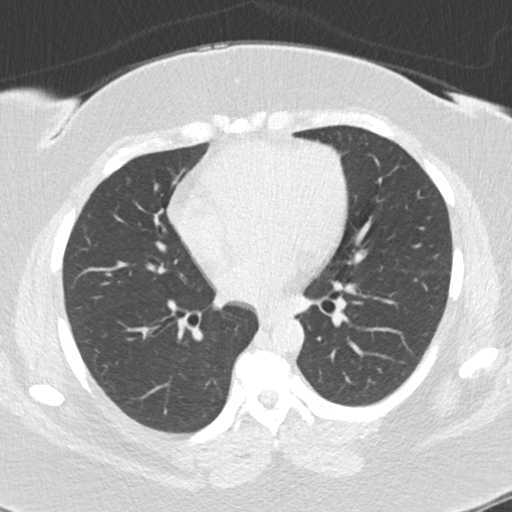
[im 77/147  lung]
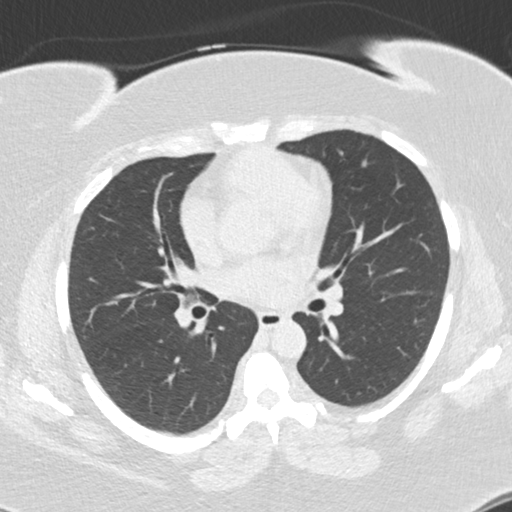
[im 91/147  lung]
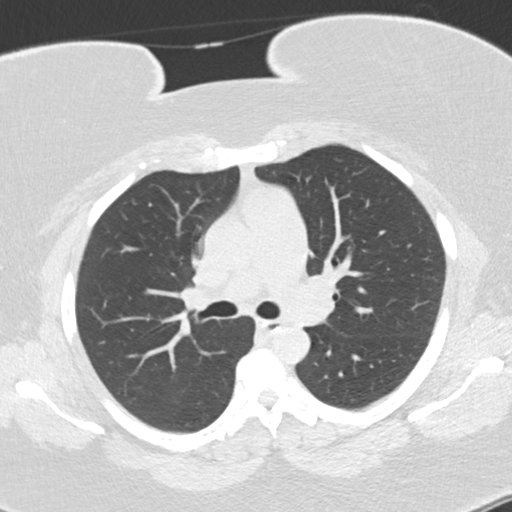
[im 105/147  mediastinal]
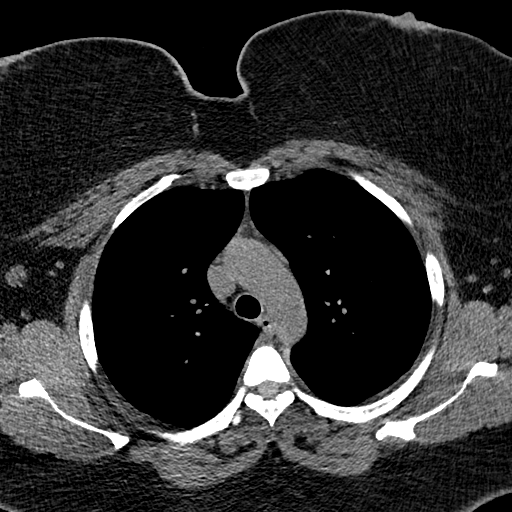
[im 105/147  lung]
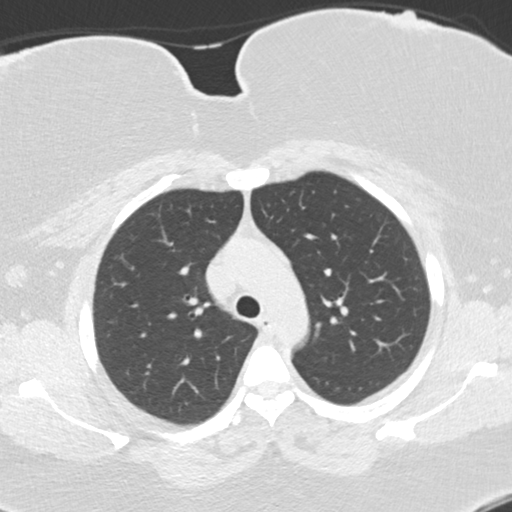
[im 112/147  lung]
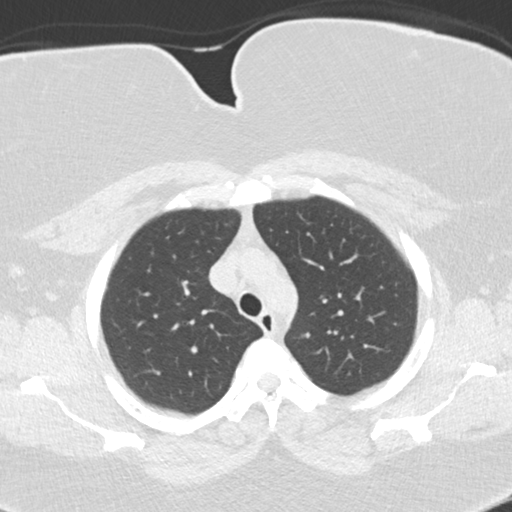
[im 126/147  lung]
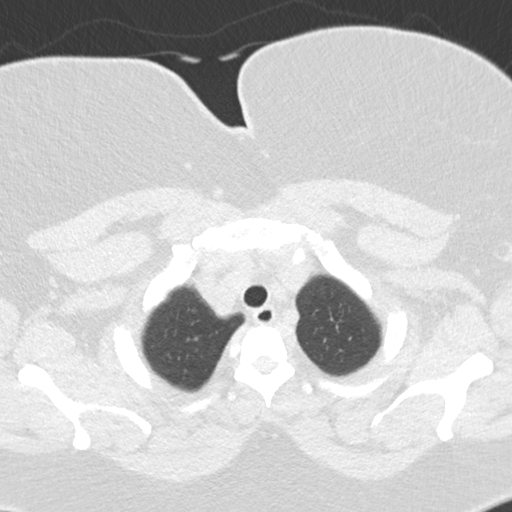
[im 140/147  lung]
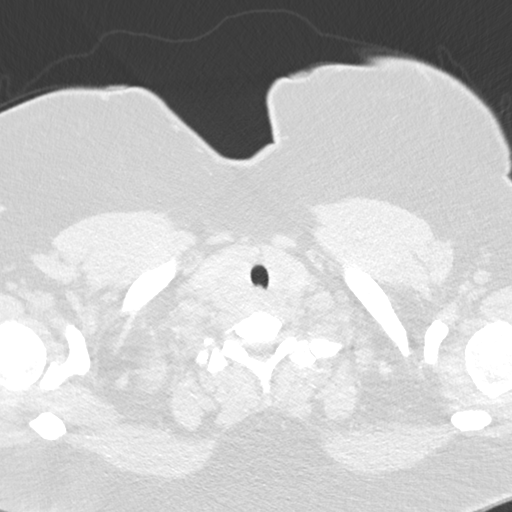

[Series 8: coronal · coronal · 0.62mm/px · 3 of 124 slices shown]
[im 25/124  lung]
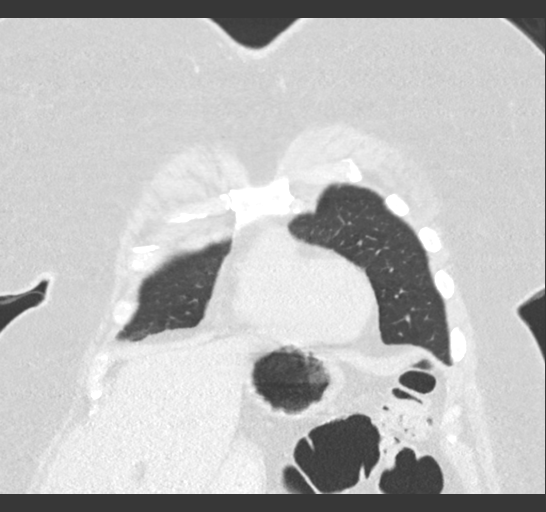
[im 50/124  lung]
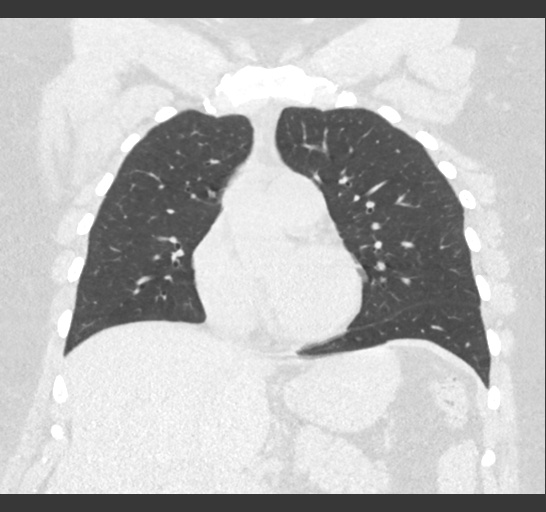
[im 74/124  lung]
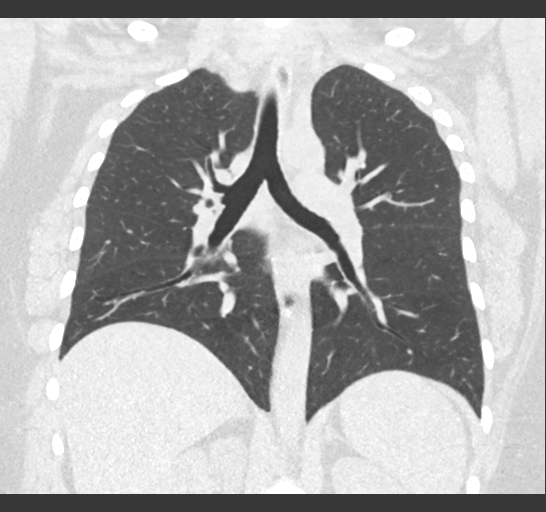

[15 of 36 positions shown; findings below may reference images not displayed]

FINDINGS: Cardiovascular: Heart size is normal. There is no significant
pericardial fluid, thickening or pericardial calcification. No
atherosclerotic calcifications are noted in the thoracic aorta or
the coronary arteries.

Mediastinum/Nodes: No pathologically enlarged mediastinal or hilar
lymph nodes. Please note that accurate exclusion of hilar adenopathy
is limited on noncontrast CT scans. Esophagus is unremarkable in
appearance. No axillary lymphadenopathy. Thyroid gland is diffusely
enlarged and heterogeneous in appearance, with slight asymmetry
(right lobe enlargement is greater than the left), suggestive of a
multinodular goiter.

Lungs/Pleura: High-resolution images demonstrate no significant
regions of ground-glass attenuation, subpleural reticulation,
parenchymal banding, traction bronchiectasis or frank honeycombing.
Inspiratory and expiratory imaging is unremarkable. No acute
consolidative airspace disease. No pleural effusions. No suspicious
appearing pulmonary nodules or masses are noted.

Upper Abdomen: Unremarkable.

Musculoskeletal: There are no aggressive appearing lytic or blastic
lesions noted in the visualized portions of the skeleton.
IMPRESSION: 1. No findings to suggest interstitial lung disease.
2. No acute findings in the thorax to account for the patient's
symptoms.
3. Probable multinodular goiter.

## 2019-06-08 NOTE — Telephone Encounter (Signed)
Sent letter out

## 2019-06-08 NOTE — Progress Notes (Signed)
Sending letter out

## 2019-07-05 ENCOUNTER — Inpatient Hospital Stay: Admission: RE | Admit: 2019-07-05 | Payer: No Typology Code available for payment source | Source: Ambulatory Visit

## 2019-09-28 ENCOUNTER — Encounter: Payer: Self-pay | Admitting: Podiatry

## 2019-09-28 NOTE — Addendum Note (Signed)
Addended by: Marcie Mowers on: 09/28/2019 04:29 PM   Modules accepted: Orders

## 2019-09-28 NOTE — Addendum Note (Signed)
Addended by: Parthenia Ames on: 09/28/2019 04:32 PM   Modules accepted: Orders

## 2019-10-03 ENCOUNTER — Encounter: Payer: Self-pay | Admitting: Podiatry

## 2019-10-03 ENCOUNTER — Encounter: Payer: Self-pay | Admitting: Endocrinology

## 2019-11-04 ENCOUNTER — Ambulatory Visit: Payer: No Typology Code available for payment source | Admitting: Medical

## 2019-11-07 ENCOUNTER — Ambulatory Visit (INDEPENDENT_AMBULATORY_CARE_PROVIDER_SITE_OTHER): Payer: No Typology Code available for payment source | Admitting: Medical

## 2019-11-07 ENCOUNTER — Other Ambulatory Visit: Payer: Self-pay

## 2019-11-07 VITALS — BP 128/90 | HR 113 | Resp 16 | Ht 65.0 in | Wt 295.6 lb

## 2019-11-07 DIAGNOSIS — J439 Emphysema, unspecified: Secondary | ICD-10-CM

## 2019-11-07 DIAGNOSIS — M79671 Pain in right foot: Secondary | ICD-10-CM | POA: Diagnosis not present

## 2019-11-07 DIAGNOSIS — F17200 Nicotine dependence, unspecified, uncomplicated: Secondary | ICD-10-CM

## 2019-11-07 DIAGNOSIS — E118 Type 2 diabetes mellitus with unspecified complications: Secondary | ICD-10-CM | POA: Diagnosis not present

## 2019-11-07 DIAGNOSIS — R5383 Other fatigue: Secondary | ICD-10-CM

## 2019-11-07 MED ORDER — CHANTIX STARTING MONTH PAK 0.5 MG X 11 & 1 MG X 42 PO TABS: ORAL_TABLET | ORAL | 0 refills | Status: AC

## 2019-11-07 MED ORDER — VENTOLIN HFA 108 (90 BASE) MCG/ACT IN AERS: 2.0000 | INHALATION_SPRAY | Freq: Four times a day (QID) | RESPIRATORY_TRACT | 0 refills | Status: AC | PRN

## 2019-11-07 MED ORDER — BUDESONIDE-FORMOTEROL FUMARATE 160-4.5 MCG/ACT IN AERO: 2.0000 | INHALATION_SPRAY | Freq: Two times a day (BID) | RESPIRATORY_TRACT | 3 refills | Status: AC

## 2019-11-07 MED ORDER — OZEMPIC (1 MG/DOSE) 2 MG/1.5ML ~~LOC~~ SOPN: 1.0000 mg | PEN_INJECTOR | SUBCUTANEOUS | 11 refills | Status: DC

## 2019-11-07 MED FILL — OZEMPIC 1 MG/DOSE SOPN: 2 | 56 days supply | Qty: 3 | Fill #0

## 2019-11-07 MED FILL — SYMBICORT 160-4.5 MCG INH: 160-4.5 | 30 days supply | Qty: 10 | Fill #0

## 2019-11-07 MED FILL — ALBUTEROL SULFATE HFA 108 (: 108 (90 BAS | 25 days supply | Qty: 18 | Fill #0

## 2019-11-07 MED FILL — NICOTINE 21 MG/24HR PATCH: 21 | 28 days supply | Qty: 28 | Fill #0

## 2019-11-07 NOTE — Progress Notes (Signed)
Subjective:    Patient ID: Katie Luna, female    DOB: [birth year], 44 y.o.   MRN: [id number]  HPI  Pt in for follow up.  Pt states needs referrals/ advise.   Pt needs referral to endocrinologist, pulmonologist and podiatrist.(insurance requires referrals)  Pt last a1c October 02-18-2019. Pt was seeing Dr Everardo All. She has jardiance and ozempiq.  Pt needs referral to pulmonologist. Last them in 2019. Pt had issues with her insurance. Pt saw Dr Kendrick Fries in the past. She wants to get re-established. Pt admits that she started smoking again and she is beginning to get chronic cough again and some wheezing. No fever or chest congestion.  Pt is reporting fatigue. Example woke up from nap this weekend and was tired.  Hx of anemia due to heavy menses..  Pt also has rt great toe toenail disfigured and some talofibular pain. Pt states podiatrist think she may have ganglion cyst near ankle.   Pt has started to smoke again. She states prior use chantix did not cause depression and no bad dreams. Wants to restart.   Pt has been vaccinated against covid.     Review of Systems  Constitutional: Positive for fatigue. Negative for chills and fever.  HENT: Negative for congestion, ear pain, sinus pressure and sinus pain.   Respiratory: Positive for cough and wheezing. Negative for chest tightness and shortness of breath.   Cardiovascular: Negative for chest pain and palpitations.  Gastrointestinal: Negative for abdominal pain.  Genitourinary: Negative for dysuria.  Musculoskeletal: Negative for back pain.       Foot pain.  Skin: Negative for rash.  Neurological: Negative for dizziness, syncope, weakness and headaches.  Hematological: Negative for adenopathy. Does not bruise/bleed easily.  Psychiatric/Behavioral: Negative for behavioral problems and decreased concentration.    Past Medical History:  Diagnosis Date  . Anemia   . Diabetes mellitus without complication (HCC)   . GERD  (gastroesophageal reflux disease)   . Goiter 12/17/2018  . Polycystic ovary syndrome      Social History   Socioeconomic History  . Marital status: Married    Spouse name: Not on file  . Number of children: 1  . Years of education: LPN  . Highest education level: Not on file  Occupational History  . Not on file  Tobacco Use  . Smoking status: Former Smoker    Years: 29.00    Types: Cigarettes    Quit date: 10/19/2017    Years since quitting: 2.0  . Smokeless tobacco: Never Used  Vaping Use  . Vaping Use: Never used  Substance and Sexual Activity  . Alcohol use: Yes    Comment: very rare infrequent. occaisional glass of wine.  . Drug use: No  . Sexual activity: Yes  Other Topics Concern  . Not on file  Social History Narrative  . Not on file   Social Determinants of Health   Financial Resource Strain:   . Difficulty of Paying Living Expenses:   Food Insecurity:   . Worried About Programme researcher, broadcasting/film/video in the Last Year:   . Barista in the Last Year:   Transportation Needs:   . Freight forwarder (Medical):   Marland Kitchen Lack of Transportation (Non-Medical):   Physical Activity:   . Days of Exercise per Week:   . Minutes of Exercise per Session:   Stress:   . Feeling of Stress :   Social Connections:   . Frequency of Communication with Friends and  Family:   . Frequency of Social Gatherings with Friends and Family:   . Attends Religious Services:   . Active Member of Clubs or Organizations:   . Attends Banker Meetings:   Marland Kitchen Marital Status:   Intimate Partner Violence:   . Fear of Current or Ex-Partner:   . Emotionally Abused:   Marland Kitchen Physically Abused:   . Sexually Abused:     Past Surgical History:  Procedure Laterality Date  . CHOLECYSTECTOMY      Family History  Problem Relation Age of Onset  . Diabetes Mother   . Cancer Mother   . Sickle cell anemia Father   . Alcohol abuse Father   . Colon cancer Father   . Diabetes Maternal Grandfather      Allergies  Allergen Reactions  . Other Anaphylaxis  . Shellfish Allergy Swelling    Current Outpatient Medications on File Prior to Visit  Medication Sig Dispense Refill  . albuterol (PROVENTIL) (2.5 MG/3ML) 0.083% nebulizer solution Take 3 mLs (2.5 mg total) by nebulization every 6 (six) hours as needed for wheezing or shortness of breath. 150 mL 1  . beclomethasone (QVAR) 40 MCG/ACT inhaler Inhale into the lungs 2 (two) times daily. Pt unsure of dosage (Patient not taking: Reported on 11/07/2019)    . buPROPion (WELLBUTRIN XL) 150 MG 24 hr tablet Take 1 tablet (150 mg total) by mouth daily. 30 tablet 1  . DAYSEE 0.15-0.03 &0.01 MG tablet TAKE 1 TABLET BY MOUTH DAILY. 91 tablet 4  . empagliflozin (JARDIANCE) 10 MG TABS tablet Take 10 mg by mouth daily. 90 tablet 3  . fluticasone (FLONASE) 50 MCG/ACT nasal spray Place 2 sprays into both nostrils daily. 16 g 1  . glucose blood (ACCU-CHEK GUIDE) test strip 1 each by Other route 2 (two) times daily. And lancets 2/day 200 each 3  . hydrocortisone 2.5 % ointment Apply topically 2 (two) times daily. (Patient not taking: Reported on 05/03/2019) 30 g 0  . insulin lispro (HUMALOG) 100 UNIT/ML injection Inject into the skin. Sliding scale    . ipratropium-albuterol (DUONEB) 0.5-2.5 (3) MG/3ML SOLN Take 3 mLs by nebulization every 4 (four) hours as needed. (Patient not taking: Reported on 05/03/2019) 360 mL 0  . ketoconazole (NIZORAL) 2 % shampoo Apply 1 application topically 2 (two) times a week. (Patient not taking: Reported on 05/03/2019) 120 mL 1  . lamoTRIgine (LAMICTAL) 200 MG tablet Take 200 mg by mouth daily.   99  . levocetirizine (XYZAL) 5 MG tablet Take 1 tablet (5 mg total) by mouth every evening. 30 tablet 0  . meloxicam (MOBIC) 15 MG tablet Take 1 tablet (15 mg total) by mouth daily. 14 tablet 0  . metFORMIN (GLUCOPHAGE) 500 MG tablet Take 1 tablet (500 mg total) by mouth 2 (two) times daily with a meal. 180 tablet 3  . montelukast  (SINGULAIR) 10 MG tablet Take 1 tablet (10 mg total) by mouth at bedtime. (Patient not taking: Reported on 05/03/2019) 30 tablet 3  . pantoprazole (PROTONIX) 40 MG tablet Take 1 tablet (40 mg total) by mouth daily. 30 tablet 3  . phentermine 37.5 MG capsule Take 1 capsule (37.5 mg total) by mouth every morning. (Patient not taking: Reported on 05/03/2019) 30 capsule 0  . repaglinide (PRANDIN) 1 MG tablet Take 1 tablet (1 mg total) by mouth 3 (three) times daily before meals. (Patient not taking: Reported on 05/03/2019) 270 tablet 3  . Spacer/Aero-Holding Chambers (AEROCHAMBER MV) inhaler Use as instructed (Patient  not taking: Reported on 11/07/2019) 1 each 2   No current facility-administered medications on file prior to visit.    BP 128/90 (BP Location: Right Arm, Patient Position: Sitting, Cuff Size: Large)   Pulse (!) 113   Resp 16   Ht 5\' 5"  (1.651 m)   Wt 295 lb 9.6 oz (134.1 kg)   SpO2 98%   BMI 49.19 kg/m      Objective:   Physical Exam  General Mental Status- Alert. General Appearance- Not in acute distress.   Skin General: Color- Normal Color. Moisture- Normal Moisture.  Neck Carotid Arteries- Normal color. Moisture- Normal Moisture. No carotid bruits. No JVD.  Chest and Lung Exam Auscultation: Breath Sounds:-Normal.  Cardiovascular Auscultation:Rythm- Regular. Murmurs & Other Heart Sounds:Auscultation of the heart reveals- No Murmurs.  Abdomen Inspection:-Inspeection Normal. Palpation/Percussion:Note:No mass. Palpation and Percussion of the abdomen reveal- Non Tender, Non Distended + BS, no rebound or guarding.    Neurologic Cranial Nerve exam:- CN III-XII intact(No nystagmus), symmetric smile. Strength:- 5/5 equal and symmetric strength both upper and lower extremities.  Rt foot/ankle- mild swelling talofibular area. Rt great toe nail disfigured nail.  Rt lower ext- negative homans sign.    Assessment & Plan:  For diabetes continue jardiance and refilling  ozempic. Future a1c and cmp placed. Refer to endocrinologist.  For copd history and recent onset dry cough with wheeze, I prescribed symbicort and albuterol. Referral to pulmonologist placed.  For rt foot pain referred to podiatrist.  For smoking cessation rx chantix.  For fatigue, future cbc, iron, b12, b1 and tsh placed. Included lipid panel.  Follow up in 1 month or as needed.  Time spent with patient today was  40- minutes which consisted of chart review, discussing diagnosis, work up, treatmen placing 3 referrals  and documentation.

## 2019-11-07 NOTE — Patient Instructions (Addendum)
For diabetes continue jardiance and refilling ozempic. Future a1c and cmp placed. Refer to endocrinologist.  For copd history and recent onset dry cough with wheeze, I prescribed symbicort and albuterol. Referral to pulmonologist placed.  For rt foot pain referred to podiatrist.  For smoking cessation rx chantix.  For fatigue, future cbc, iron, b12, b1 and tsh placed. Included lipid panel.  Follow up in 1 month or as needed.

## 2019-11-15 ENCOUNTER — Telehealth: Payer: Self-pay | Admitting: Medical

## 2019-11-15 NOTE — Telephone Encounter (Signed)
As we discussed Katie Luna across [address] from [place] [facility]. Could you put in all future labs and designate Katie Luna as lab.  Ask pt to get at [facility]. I don't see reason to get done at [facility]. Makes it difficult for [place].   Literally Katie Luna across street from [place]   Can you see all labs I ordered with associated diagnosis.

## 2019-11-15 NOTE — Telephone Encounter (Signed)
Spoke with the Lab at [facility] and they stated they were unclear on how that would work & our lab stated a prescription written with a  dx and labs written out may work.

## 2019-11-15 NOTE — Telephone Encounter (Signed)
Patient is requesting that labs be sent to [facility] location for blood draw. Patient states she can not make it to lab at our office or [facility] before they close.

## 2019-11-17 NOTE — Telephone Encounter (Signed)
Called pt left voicemail to return call

## 2019-11-17 NOTE — Telephone Encounter (Signed)
Sent message via mychart

## 2019-12-09 ENCOUNTER — Other Ambulatory Visit (INDEPENDENT_AMBULATORY_CARE_PROVIDER_SITE_OTHER): Payer: No Typology Code available for payment source

## 2019-12-09 ENCOUNTER — Other Ambulatory Visit: Payer: Self-pay

## 2019-12-09 DIAGNOSIS — E118 Type 2 diabetes mellitus with unspecified complications: Secondary | ICD-10-CM | POA: Diagnosis not present

## 2019-12-09 DIAGNOSIS — R5383 Other fatigue: Secondary | ICD-10-CM

## 2019-12-09 LAB — COMPREHENSIVE METABOLIC PANEL
ALT: 13 U/L (ref 0–35)
AST: 10 U/L (ref 0–37)
Albumin: 3.6 g/dL (ref 3.5–5.2)
Alkaline Phosphatase: 88 U/L (ref 39–117)
BUN: 6 mg/dL (ref 6–23)
CO2: 28 mEq/L (ref 19–32)
Calcium: 8.6 mg/dL (ref 8.4–10.5)
Chloride: 103 mEq/L (ref 96–112)
Creatinine, Ser: 0.66 mg/dL (ref 0.40–1.20)
GFR: 117.61 mL/min (ref >60.00)
Glucose, Bld: 220 mg/dL — ABNORMAL HIGH (ref 70–99)
Potassium: 4.2 mEq/L (ref 3.5–5.1)
Sodium: 137 mEq/L (ref 135–145)
Total Bilirubin: 0.6 mg/dL (ref 0.2–1.2)
Total Protein: 7.1 g/dL (ref 6.0–8.3)

## 2019-12-09 LAB — CBC WITH DIFFERENTIAL/PLATELET
Basophils Absolute: 0 10*3/uL (ref 0.0–0.1)
Basophils Relative: 0.8 % (ref 0.0–3.0)
Eosinophils Absolute: 0.1 10*3/uL (ref 0.0–0.7)
Eosinophils Relative: 2.3 % (ref 0.0–5.0)
HCT: 40.4 % (ref 36.0–46.0)
Hemoglobin: 13.5 g/dL (ref 12.0–15.0)
Lymphocytes Relative: 44 % (ref 12.0–46.0)
Lymphs Abs: 2.3 10*3/uL (ref 0.7–4.0)
MCHC: 33.4 g/dL (ref 30.0–36.0)
MCV: 97.1 fl (ref 78.0–100.0)
Monocytes Absolute: 0.4 10*3/uL (ref 0.1–1.0)
Monocytes Relative: 8.2 % (ref 3.0–12.0)
Neutro Abs: 2.4 10*3/uL (ref 1.4–7.7)
Neutrophils Relative %: 44.7 % (ref 43.0–77.0)
Platelets: 196 10*3/uL (ref 150.0–400.0)
RBC: 4.17 Mil/uL (ref 3.87–5.11)
RDW: 13.2 % (ref 11.5–15.5)
WBC: 5.3 10*3/uL (ref 4.0–10.5)

## 2019-12-09 LAB — LIPID PANEL
Cholesterol: 142 mg/dL (ref 0–200)
HDL: 30.9 mg/dL — ABNORMAL LOW (ref >39.00)
LDL Cholesterol: 93 mg/dL (ref 0–99)
NonHDL: 110.89
Total CHOL/HDL Ratio: 5
Triglycerides: 90 mg/dL (ref 0.0–149.0)
VLDL: 18 mg/dL (ref 0.0–40.0)

## 2019-12-09 LAB — HEMOGLOBIN A1C: Hgb A1c MFr Bld: 9.6 % — ABNORMAL HIGH (ref 4.6–6.5)

## 2019-12-09 LAB — VITAMIN B12: Vitamin B-12: 407 pg/mL (ref 211–911)

## 2019-12-09 LAB — IRON: Iron: 50 ug/dL (ref 42–145)

## 2019-12-09 LAB — TSH: TSH: 0.71 u[IU]/mL (ref 0.35–4.50)

## 2019-12-09 LAB — T4, FREE: Free T4: 1.19 ng/dL (ref 0.60–1.60)

## 2019-12-10 ENCOUNTER — Encounter: Payer: Self-pay | Admitting: Endocrinology

## 2019-12-10 ENCOUNTER — Telehealth: Payer: Self-pay | Admitting: Medical

## 2019-12-10 MED ORDER — ATORVASTATIN CALCIUM 10 MG PO TABS: ORAL_TABLET | ORAL | 3 refills | Status: AC

## 2019-12-10 NOTE — Telephone Encounter (Signed)
Rx latorvastatin sent to pt pharmacy.

## 2019-12-12 ENCOUNTER — Ambulatory Visit: Payer: No Typology Code available for payment source | Admitting: Endocrinology

## 2019-12-12 DIAGNOSIS — E119 Type 2 diabetes mellitus without complications: Secondary | ICD-10-CM

## 2019-12-12 MED FILL — ATORVASTATIN CALCIUM 10 MG: 10 | 84 days supply | Qty: 36 | Fill #0

## 2019-12-13 ENCOUNTER — Encounter: Payer: Self-pay | Admitting: Medical

## 2019-12-13 LAB — VITAMIN B1: Vitamin B1 (Thiamine): 8 nmol/L (ref 8–30)

## 2019-12-15 ENCOUNTER — Encounter: Payer: Self-pay | Admitting: Medical

## 2019-12-29 ENCOUNTER — Ambulatory Visit: Payer: No Typology Code available for payment source | Admitting: Endocrinology

## 2020-01-09 ENCOUNTER — Ambulatory Visit (INDEPENDENT_AMBULATORY_CARE_PROVIDER_SITE_OTHER): Payer: No Typology Code available for payment source | Admitting: Adult Health

## 2020-01-09 ENCOUNTER — Other Ambulatory Visit: Payer: Self-pay

## 2020-01-09 ENCOUNTER — Encounter: Payer: Self-pay | Admitting: Adult Health

## 2020-01-09 ENCOUNTER — Other Ambulatory Visit: Payer: Self-pay | Admitting: Adult Health

## 2020-01-09 ENCOUNTER — Ambulatory Visit (INDEPENDENT_AMBULATORY_CARE_PROVIDER_SITE_OTHER): Payer: No Typology Code available for payment source

## 2020-01-09 VITALS — BP 118/82 | HR 98 | Temp 99.1°F | Ht 65.0 in | Wt 295.2 lb

## 2020-01-09 DIAGNOSIS — J45909 Unspecified asthma, uncomplicated: Secondary | ICD-10-CM

## 2020-01-09 DIAGNOSIS — J4530 Mild persistent asthma, uncomplicated: Secondary | ICD-10-CM

## 2020-01-09 DIAGNOSIS — G4733 Obstructive sleep apnea (adult) (pediatric): Secondary | ICD-10-CM

## 2020-01-09 DIAGNOSIS — R05 Cough: Secondary | ICD-10-CM

## 2020-01-09 DIAGNOSIS — R059 Cough, unspecified: Secondary | ICD-10-CM

## 2020-01-09 HISTORY — DX: Unspecified asthma, uncomplicated: J45.909

## 2020-01-09 MED ORDER — LEVOCETIRIZINE DIHYDROCHLORIDE 5 MG PO TABS
5.0000 mg | ORAL_TABLET | Freq: Every evening | ORAL | 6 refills | Status: DC
Start: 2020-01-09 — End: 2020-01-09

## 2020-01-09 MED ORDER — MONTELUKAST SODIUM 10 MG PO TABS: 10.0000 mg | ORAL_TABLET | Freq: Every day | ORAL | 6 refills | Status: DC

## 2020-01-09 MED FILL — LEVOCETIRIZINE 5 MG TABLET: 5 | 30 days supply | Qty: 30 | Fill #0

## 2020-01-09 MED FILL — MONTELUKAST SOD 10 MG TAB: 10 | 30 days supply | Qty: 30 | Fill #0

## 2020-01-09 NOTE — Assessment & Plan Note (Signed)
Reported history of OSA.  Will discuss on follow-up visit for further evaluation and see if she needs to have a repeat sleep study

## 2020-01-09 NOTE — Progress Notes (Signed)
@Patient  ID: , female    DOB: 1976-03-16, 44 y.o.   MRN: [id number]  Chief Complaint  Patient presents with  . Follow-up    Asthma     Referring provider: [id number]  HPI: 44 yo smoker seen for pulmonary consult July 2019 for asthma Medical history significant for eczema, diabetes and OSA - not on CPAP   TEST/EVENTS :  HRCT chest 10/2017 - No ILD , no acute process, multinodular goiter.   01/09/2020 Follow up: Asthma  Patient returns for a follow-up visit.  Patient was seen last visit for pulmonary consult for asthma.  Patient says she had previously been on Symbicort, Xyzal and Singulair and was doing pretty well.  However has ran out of these medications she recently restarted on Symbicort.  She says she has noticed that her cough has picked up over the last year.  She denies any chest pain orthopnea PND or leg swelling.  Has had what she describes as some asthma flares have not use her albuterol inhaler or nebulizer. She says she has used cough drops often for an ongoing cough.  Patient has restarted smoking.  Smoking cessation was discussed. Patient has not had pulmonary function testing.  Patient did have high-resolution CT chest in July 2019 that showed no interstitial lung disease positive multinodular goiter.  Patient says she is being referred to someone to discuss her goiter.  Out of xyzal and singulair  Just startted on Symbicort   Allergies  Allergen Reactions  . Other Anaphylaxis  . Shellfish Allergy Swelling    Immunization History  Administered Date(s) Administered  . Influenza Inj Mdck Quad Pf 01/26/2019  . Influenza Split 01/29/2017  . Influenza,inj,quad, With Preservative 05/29/2015  . Moderna SARS-COVID-2 Vaccination 05/11/2019, 06/08/2019  . Pneumococcal Polysaccharide-23 09/16/2017  . Tdap 09/16/2017    Past Medical History:  Diagnosis Date  . Anemia   . Asthma 01/09/2020  . Diabetes mellitus without complication (HCC)   .  GERD (gastroesophageal reflux disease)   . Goiter 12/17/2018  . Polycystic ovary syndrome     Tobacco History: Social History   Tobacco Use  Smoking Status Former Smoker  . Packs/day: 0.25  . Years: 29.00  . Pack years: 7.25  . Types: Cigarettes  Smokeless Tobacco Never Used   Counseling given: Not Answered   Outpatient Medications Prior to Visit  Medication Sig Dispense Refill  . albuterol (PROVENTIL) (2.5 MG/3ML) 0.083% nebulizer solution Take 3 mLs (2.5 mg total) by nebulization every 6 (six) hours as needed for wheezing or shortness of breath. 150 mL 1  . atorvastatin (LIPITOR) 10 MG tablet 1 tab po Monday, Wednesday and Friday. 36 tablet 3  . budesonide-formoterol (SYMBICORT) 160-4.5 MCG/ACT inhaler Inhale 2 puffs into the lungs 2 (two) times daily. 1 Inhaler 3  . buPROPion (WELLBUTRIN XL) 150 MG 24 hr tablet Take 1 tablet (150 mg total) by mouth daily. 30 tablet 1  . DAYSEE 0.15-0.03 &0.01 MG tablet TAKE 1 TABLET BY MOUTH DAILY. 91 tablet 4  . empagliflozin (JARDIANCE) 10 MG TABS tablet Take 10 mg by mouth daily. 90 tablet 3  . fluticasone (FLONASE) 50 MCG/ACT nasal spray Place 2 sprays into both nostrils daily. 16 g 1  . glucose blood (ACCU-CHEK GUIDE) test strip 1 each by Other route 2 (two) times daily. And lancets 2/day 200 each 3  . hydrocortisone 2.5 % ointment Apply topically 2 (two) times daily. 30 g 0  . insulin lispro (HUMALOG) 100 UNIT/ML injection  Inject into the skin. Sliding scale    . ipratropium-albuterol (DUONEB) 0.5-2.5 (3) MG/3ML SOLN Take 3 mLs by nebulization every 4 (four) hours as needed. 360 mL 0  . ketoconazole (NIZORAL) 2 % shampoo Apply 1 application topically 2 (two) times a week. 120 mL 1  . lamoTRIgine (LAMICTAL) 200 MG tablet Take 200 mg by mouth daily.   99  . meloxicam (MOBIC) 15 MG tablet Take 1 tablet (15 mg total) by mouth daily. 14 tablet 0  . metFORMIN (GLUCOPHAGE) 500 MG tablet Take 1 tablet (500 mg total) by mouth 2 (two) times daily  with a meal. 180 tablet 3  . pantoprazole (PROTONIX) 40 MG tablet Take 1 tablet (40 mg total) by mouth daily. 30 tablet 3  . phentermine 37.5 MG capsule Take 1 capsule (37.5 mg total) by mouth every morning. 30 capsule 0  . Semaglutide, 1 MG/DOSE, (OZEMPIC, 1 MG/DOSE,) 2 MG/1.5ML SOPN Inject 0.75 mLs (1 mg total) into the skin once a week. 4 pen 11  . Spacer/Aero-Holding Chambers (AEROCHAMBER MV) inhaler Use as instructed 1 each 2  . varenicline (CHANTIX STARTING MONTH PAK) 0.5 MG X 11 & 1 MG X 42 tablet Take one 0.5 mg tablet by mouth once daily for 3 days, then increase to one 0.5 mg tablet twice daily for 4 days, then increase to one 1 mg tablet twice daily. 53 tablet 0  . VENTOLIN HFA 108 (90 Base) MCG/ACT inhaler Inhale 2 puffs into the lungs every 6 (six) hours as needed for wheezing or shortness of breath. 18 g 0  . beclomethasone (QVAR) 40 MCG/ACT inhaler Inhale into the lungs 2 (two) times daily. Pt unsure of dosage     . levocetirizine (XYZAL) 5 MG tablet Take 1 tablet (5 mg total) by mouth every evening. 30 tablet 0  . montelukast (SINGULAIR) 10 MG tablet Take 1 tablet (10 mg total) by mouth at bedtime. 30 tablet 3  . repaglinide (PRANDIN) 1 MG tablet Take 1 tablet (1 mg total) by mouth 3 (three) times daily before meals. 270 tablet 3   No facility-administered medications prior to visit.     Review of Systems:   Constitutional:   No  weight loss, night sweats,  Fevers, chills,  +fatigue, or  lassitude.  HEENT:   No headaches,  Difficulty swallowing,  Tooth/dental problems, or  Sore throat,                No sneezing, itching, ear ache, nasal congestion, post nasal drip,   CV:  No chest pain,  Orthopnea, PND, swelling in lower extremities, anasarca, dizziness, palpitations, syncope.   GI  No heartburn, indigestion, abdominal pain, nausea, vomiting, diarrhea, change in bowel habits, loss of appetite, bloody stools.   Resp:  .  No chest wall deformity  Skin: no rash or  lesions.  GU: no dysuria, change in color of urine, no urgency or frequency.  No flank pain, no hematuria   MS:  No joint pain or swelling.  No decreased range of motion.  No back pain.    Physical Exam  BP 118/82 (BP Location: Left Arm, Cuff Size: Normal)   Pulse 98   Temp 99.1 F (37.3 C) (Temporal)   Ht 5\' 5"  (1.651 m)   Wt 295 lb 3.2 oz (133.9 kg)   SpO2 98% Comment: RA  BMI 49.12 kg/m   GEN: A/Ox3; pleasant , NAD, well nourished    HEENT:  [place]/AT,   NOSE-clear, THROAT-clear, no lesions, no  postnasal drip or exudate noted.   NECK:  Supple w/ fair ROM; no JVD; normal carotid impulses w/o bruits; + thyromegaly  no lymphadenopathy.    RESP  Clear  P & A; w/o, wheezes/ rales/ or rhonchi. no accessory muscle use, no dullness to percussion  CARD:  RRR, no m/r/g, tr peripheral edema, pulses intact, no cyanosis or clubbing.  GI:   Soft & nt; nml bowel sounds; no organomegaly or masses detected.   Musco: Warm bil, no deformities or joint swelling noted.   Neuro: alert, no focal deficits noted.    Skin: Warm, no lesions or rashes    Lab Results:  CBC  BNP No results found for: BNP  ProBNP No results found for: PROBNP  Imaging: No results found.    No flowsheet data found.  No results found for: NITRICOXIDE      Assessment & Plan:   Asthma Mild persistent asthma with suspected allergic component.  Patient has had increased symptom burden since being off medications.  I recommended she restart Xyzal and Singulair.  She has recently restarted Symbicort hopefully this will help as well. Needs PFTs on return. Check chest x-ray. Plan  Patient Instructions  Chest xray today  Work on quitting smoking .  Restart Xyzal daily  Restart Singulair daily  Continue on Prilosec daily.  Continue on Symbicort Twice daily  , rinse after use.  Follow up Dr. Wynona Neat in 4-6 weeks with PFTs  And As needed   Please contact office for sooner follow up if symptoms do not  improve or worsen or seek emergency care       OSA (obstructive sleep apnea) Reported history of OSA.  Will discuss on follow-up visit for further evaluation and see if she needs to have a repeat sleep study     Rubye Oaks, NP 01/09/2020

## 2020-01-09 NOTE — Patient Instructions (Signed)
Chest xray today  Work on quitting smoking .  Restart Xyzal daily  Restart Singulair daily  Continue on Prilosec daily.  Continue on Symbicort Twice daily  , rinse after use.  Follow up Dr. Wynona Neat in 4-6 weeks with PFTs  And As needed   Please contact office for sooner follow up if symptoms do not improve or worsen or seek emergency care

## 2020-01-09 NOTE — Assessment & Plan Note (Signed)
Mild persistent asthma with suspected allergic component.  Patient has had increased symptom burden since being off medications.  I recommended she restart Xyzal and Singulair.  She has recently restarted Symbicort hopefully this will help as well. Needs PFTs on return. Check chest x-ray. Plan  Patient Instructions  Chest xray today  Work on quitting smoking .  Restart Xyzal daily  Restart Singulair daily  Continue on Prilosec daily.  Continue on Symbicort Twice daily  , rinse after use.  Follow up Dr. Wynona Neat in 4-6 weeks with PFTs  And As needed   Please contact office for sooner follow up if symptoms do not improve or worsen or seek emergency care

## 2020-01-30 ENCOUNTER — Other Ambulatory Visit (HOSPITAL_BASED_OUTPATIENT_CLINIC_OR_DEPARTMENT_OTHER): Payer: Self-pay | Admitting: Endocrinology

## 2020-01-30 ENCOUNTER — Ambulatory Visit (INDEPENDENT_AMBULATORY_CARE_PROVIDER_SITE_OTHER): Payer: No Typology Code available for payment source | Admitting: Endocrinology

## 2020-01-30 ENCOUNTER — Other Ambulatory Visit: Payer: Self-pay

## 2020-01-30 ENCOUNTER — Encounter: Payer: Self-pay | Admitting: Endocrinology

## 2020-01-30 VITALS — BP 122/80 | HR 95 | Ht 65.0 in | Wt 299.4 lb

## 2020-01-30 DIAGNOSIS — E119 Type 2 diabetes mellitus without complications: Secondary | ICD-10-CM

## 2020-01-30 DIAGNOSIS — Z794 Long term (current) use of insulin: Secondary | ICD-10-CM

## 2020-01-30 LAB — POCT GLYCOSYLATED HEMOGLOBIN (HGB A1C): Hemoglobin A1C: 8.5 % — AB (ref 4.0–5.6)

## 2020-01-30 MED ORDER — OZEMPIC (1 MG/DOSE) 2 MG/1.5ML ~~LOC~~ SOPN
1.0000 mg | PEN_INJECTOR | SUBCUTANEOUS | 3 refills | Status: DC
Start: 2020-01-30 — End: 2020-09-06

## 2020-01-30 MED ORDER — REPAGLINIDE 1 MG PO TABS
1.0000 mg | ORAL_TABLET | Freq: Three times a day (TID) | ORAL | 3 refills | Status: DC
Start: 2020-01-30 — End: 2020-01-30

## 2020-01-30 MED FILL — REPAGLINIDE 1 MG TABLET: 1 | 90 days supply | Qty: 270 | Fill #0

## 2020-01-30 MED FILL — OZEMPIC (1 MG/DOSE) 4 MG/3M: 4 | 84 days supply | Qty: 9 | Fill #0

## 2020-01-30 NOTE — Patient Instructions (Addendum)
I have sent a prescription to your pharmacy, to add repaglinide, and: Please continue the same other diabetes medications.  check your blood sugar once a day.  vary the time of day when you check, between before the 3 meals, and at bedtime.  also check if you have symptoms of your blood sugar being too high or too low.  please keep a record of the readings and bring it to your next appointment here (or you can bring the meter itself).  You can write it on any piece of paper.  please call us sooner if your blood sugar goes below 70, or if you have a lot of readings over 200. Please come back for a follow-up appointment in 6 weeks.

## 2020-01-30 NOTE — Progress Notes (Signed)
Subjective:    Patient ID: Katie Luna, female    DOB: [birth year], 44 y.o.   MRN: [id number]  HPI Pt returns for f/u of diabetes mellitus:  DM type: 2 Dx'ed: 2004 Complications: none Therapy: Ozempic and 2 oral meds.   GDM: 1998 DKA: never Severe hypoglycemia: once, in 2004.  Pancreatitis: several episode of GB pancreatitis (none since cholecystect in 1998).   Pancreatic imaging: no result available to [place].  Other: she did not tolerate Bydureon (nodules at injections sites); she cannot take pioglitazone, due to edema; she took insulin 2018-2021  Interval history: no cbg record, but states cbg's vary from 120-437.  She takes as rx'ed, except she did not take repaglinide.  Past Medical History:  Diagnosis Date  . Anemia   . Asthma 01/09/2020  . Diabetes mellitus without complication (HCC)   . GERD (gastroesophageal reflux disease)   . Goiter 12/17/2018  . Polycystic ovary syndrome     Past Surgical History:  Procedure Laterality Date  . CHOLECYSTECTOMY      Social History   Socioeconomic History  . Marital status: Married    Spouse name: Not on file  . Number of children: 1  . Years of education: LPN  . Highest education level: Not on file  Occupational History  . Not on file  Tobacco Use  . Smoking status: Former Smoker    Packs/day: 0.25    Years: 29.00    Pack years: 7.25    Types: Cigarettes  . Smokeless tobacco: Never Used  Vaping Use  . Vaping Use: Never used  Substance and Sexual Activity  . Alcohol use: Yes    Comment: very rare infrequent. occaisional glass of wine.  . Drug use: No  . Sexual activity: Yes  Other Topics Concern  . Not on file  Social History Narrative  . Not on file   Social Determinants of Health   Financial Resource Strain:   . Difficulty of Paying Living Expenses: Not on file  Food Insecurity:   . Worried About Programme researcher, broadcasting/film/video in the Last Year: Not on file  . Ran Out of Food in the Last Year: Not on file   Transportation Needs:   . Lack of Transportation (Medical): Not on file  . Lack of Transportation (Non-Medical): Not on file  Physical Activity:   . Days of Exercise per Week: Not on file  . Minutes of Exercise per Session: Not on file  Stress:   . Feeling of Stress : Not on file  Social Connections:   . Frequency of Communication with Friends and Family: Not on file  . Frequency of Social Gatherings with Friends and Family: Not on file  . Attends Religious Services: Not on file  . Active Member of Clubs or Organizations: Not on file  . Attends Banker Meetings: Not on file  . Marital Status: Not on file  Intimate Partner Violence:   . Fear of Current or Ex-Partner: Not on file  . Emotionally Abused: Not on file  . Physically Abused: Not on file  . Sexually Abused: Not on file    Current Outpatient Medications on File Prior to Visit  Medication Sig Dispense Refill  . albuterol (PROVENTIL) (2.5 MG/3ML) 0.083% nebulizer solution Take 3 mLs (2.5 mg total) by nebulization every 6 (six) hours as needed for wheezing or shortness of breath. 150 mL 1  . atorvastatin (LIPITOR) 10 MG tablet 1 tab po Monday, Wednesday and Friday. 36 tablet 3  .  budesonide-formoterol (SYMBICORT) 160-4.5 MCG/ACT inhaler Inhale 2 puffs into the lungs 2 (two) times daily. 1 Inhaler 3  . buPROPion (WELLBUTRIN XL) 150 MG 24 hr tablet Take 1 tablet (150 mg total) by mouth daily. 30 tablet 1  . DAYSEE 0.15-0.03 &0.01 MG tablet TAKE 1 TABLET BY MOUTH DAILY. 91 tablet 4  . empagliflozin (JARDIANCE) 10 MG TABS tablet Take 10 mg by mouth daily. 90 tablet 3  . fluticasone (FLONASE) 50 MCG/ACT nasal spray Place 2 sprays into both nostrils daily. 16 g 1  . glucose blood (ACCU-CHEK GUIDE) test strip 1 each by Other route 2 (two) times daily. And lancets 2/day 200 each 3  . hydrocortisone 2.5 % ointment Apply topically 2 (two) times daily. 30 g 0  . ipratropium-albuterol (DUONEB) 0.5-2.5 (3) MG/3ML SOLN Take 3  mLs by nebulization every 4 (four) hours as needed. 360 mL 0  . ketoconazole (NIZORAL) 2 % shampoo Apply 1 application topically 2 (two) times a week. 120 mL 1  . lamoTRIgine (LAMICTAL) 200 MG tablet Take 200 mg by mouth daily.   99  . levocetirizine (XYZAL) 5 MG tablet Take 1 tablet (5 mg total) by mouth every evening. 30 tablet 6  . meloxicam (MOBIC) 15 MG tablet Take 1 tablet (15 mg total) by mouth daily. 14 tablet 0  . metFORMIN (GLUCOPHAGE) 500 MG tablet Take 1 tablet (500 mg total) by mouth 2 (two) times daily with a meal. 180 tablet 3  . montelukast (SINGULAIR) 10 MG tablet Take 1 tablet (10 mg total) by mouth at bedtime. 30 tablet 6  . pantoprazole (PROTONIX) 40 MG tablet Take 1 tablet (40 mg total) by mouth daily. 30 tablet 3  . phentermine 37.5 MG capsule Take 1 capsule (37.5 mg total) by mouth every morning. 30 capsule 0  . Spacer/Aero-Holding Chambers (AEROCHAMBER MV) inhaler Use as instructed 1 each 2  . varenicline (CHANTIX STARTING MONTH PAK) 0.5 MG X 11 & 1 MG X 42 tablet Take one 0.5 mg tablet by mouth once daily for 3 days, then increase to one 0.5 mg tablet twice daily for 4 days, then increase to one 1 mg tablet twice daily. 53 tablet 0  . VENTOLIN HFA 108 (90 Base) MCG/ACT inhaler Inhale 2 puffs into the lungs every 6 (six) hours as needed for wheezing or shortness of breath. 18 g 0   No current facility-administered medications on file prior to visit.    Allergies  Allergen Reactions  . Other Anaphylaxis  . Shellfish Allergy Swelling    Family History  Problem Relation Age of Onset  . Diabetes Mother   . Cancer Mother   . Sickle cell anemia Father   . Alcohol abuse Father   . Colon cancer Father   . Diabetes Maternal Grandfather     BP 122/80 (BP Location: Left Arm, Patient Position: Sitting, Cuff Size: Normal)   Pulse 95   Ht 5\' 5"  (1.651 m)   Wt 299 lb 6.4 oz (135.8 kg)   SpO2 99%   BMI 49.82 kg/m    Review of Systems She denies hypoglycemia     Objective:   Physical Exam VITAL SIGNS:  See vs page GENERAL: no distress Pulses: dorsalis pedis intact bilat.   MSK: no deformity of the feet CV: 1+ bilat leg edema Skin:  no ulcer on the feet.  normal color and temp on the feet. Neuro: sensation is intact to touch on the feet Ext: there is bilateral onychomycosis of the toenails.  Lab Results  Component Value Date   HGBA1C 8.5 (A) 01/30/2020       Assessment & Plan:  Type 2 DM: uncontrolled.   Patient Instructions  I have sent a prescription to your pharmacy, to add repaglinide, and: Please continue the same other diabetes medications.  check your blood sugar once a day.  vary the time of day when you check, between before the 3 meals, and at bedtime.  also check if you have symptoms of your blood sugar being too high or too low.  please keep a record of the readings and bring it to your next appointment here (or you can bring the meter itself).  You can write it on any piece of paper.  please call us sooner if your blood sugar goes below 70, or if you have a lot of readings over 200. Please come back for a follow-up appointment in 6 weeks.

## 2020-02-01 ENCOUNTER — Ambulatory Visit (INDEPENDENT_AMBULATORY_CARE_PROVIDER_SITE_OTHER): Payer: No Typology Code available for payment source | Admitting: Medical

## 2020-02-01 ENCOUNTER — Other Ambulatory Visit: Payer: Self-pay

## 2020-02-01 ENCOUNTER — Other Ambulatory Visit: Payer: Self-pay | Admitting: Medical

## 2020-02-01 VITALS — BP 133/85 | HR 110 | Temp 98.7°F | Resp 18 | Ht 65.0 in | Wt 298.0 lb

## 2020-02-01 DIAGNOSIS — E049 Nontoxic goiter, unspecified: Secondary | ICD-10-CM

## 2020-02-01 DIAGNOSIS — J4530 Mild persistent asthma, uncomplicated: Secondary | ICD-10-CM

## 2020-02-01 DIAGNOSIS — E118 Type 2 diabetes mellitus with unspecified complications: Secondary | ICD-10-CM

## 2020-02-01 DIAGNOSIS — F319 Bipolar disorder, unspecified: Secondary | ICD-10-CM | POA: Diagnosis not present

## 2020-02-01 MED ORDER — LAMOTRIGINE 200 MG PO TABS: 200.0000 mg | ORAL_TABLET | Freq: Every day | ORAL | 3 refills | Status: DC

## 2020-02-01 MED ORDER — BUPROPION HCL ER (XL) 150 MG PO TB24: 150.0000 mg | ORAL_TABLET | Freq: Every day | ORAL | 11 refills | Status: DC

## 2020-02-01 MED FILL — buPROPion HCL ER (XL) 150 M: 150 | 30 days supply | Qty: 30 | Fill #0

## 2020-02-01 MED FILL — lamoTRIgine 200 MG TABS: 200 | 90 days supply | Qty: 90 | Fill #0

## 2020-02-01 NOTE — Progress Notes (Signed)
Subjective:    Patient ID: Katie Luna, female    DOB: 19-Feb-1976, 44 y.o.   MRN: [id number]  HPI  Pt in for follow up with her mood. Pt states she has been trying to get her psychiatric meds. Former provider is now in [facility]. Pt former behavioral with [facility]. Many of providers did leave. Pt states mood is controled with Wellbutrin and lamictal.  Pt has seen endocrinologist. Pt has diabetes.    Pt has also followed up with pulmonologist. She will pft tomorrow. Then will follow up on Monday. Pt continues on symbicort.  Pt told had goiter by pulmonologist. I don't see that in endocrinologist notes. But did find the Dr. Everardo All put in [place] order.  Review of Systems  Constitutional: Negative for chills and fatigue.       Lamictal seems to effect appetitie and she gains weight when using.  Respiratory: Negative for cough, chest tightness and wheezing.   Cardiovascular: Negative for chest pain and palpitations.  Gastrointestinal: Negative for abdominal pain.  Musculoskeletal: Negative for back pain.  Hematological: Negative for adenopathy. Does not bruise/bleed easily.  Psychiatric/Behavioral: Negative for behavioral problems, decreased concentration and dysphoric mood. The patient is not nervous/anxious.        Doing well with psychiatry meds.    Past Medical History:  Diagnosis Date  . Anemia   . Asthma 01/09/2020  . Diabetes mellitus without complication (HCC)   . GERD (gastroesophageal reflux disease)   . Goiter 12/17/2018  . Polycystic ovary syndrome      Social History   Socioeconomic History  . Marital status: Married    Spouse name: Not on file  . Number of children: 1  . Years of education: LPN  . Highest education level: Not on file  Occupational History  . Not on file  Tobacco Use  . Smoking status: Former Smoker    Packs/day: 0.25    Years: 29.00    Pack years: 7.25    Types: Cigarettes  . Smokeless tobacco: Never Used  Vaping Use  .  Vaping Use: Never used  Substance and Sexual Activity  . Alcohol use: Yes    Comment: very rare infrequent. occaisional glass of wine.  . Drug use: No  . Sexual activity: Yes  Other Topics Concern  . Not on file  Social History Narrative  . Not on file   Social Determinants of Health   Financial Resource Strain:   . Difficulty of Paying Living Expenses: Not on file  Food Insecurity:   . Worried About Programme researcher, broadcasting/film/video in the Last Year: Not on file  . Ran Out of Food in the Last Year: Not on file  Transportation Needs:   . Lack of Transportation (Medical): Not on file  . Lack of Transportation (Non-Medical): Not on file  Physical Activity:   . Days of Exercise per Week: Not on file  . Minutes of Exercise per Session: Not on file  Stress:   . Feeling of Stress : Not on file  Social Connections:   . Frequency of Communication with Friends and Family: Not on file  . Frequency of Social Gatherings with Friends and Family: Not on file  . Attends Religious Services: Not on file  . Active Member of Clubs or Organizations: Not on file  . Attends Banker Meetings: Not on file  . Marital Status: Not on file  Intimate Partner Violence:   . Fear of Current or Ex-Partner: Not on  file  . Emotionally Abused: Not on file  . Physically Abused: Not on file  . Sexually Abused: Not on file    Past Surgical History:  Procedure Laterality Date  . CHOLECYSTECTOMY      Family History  Problem Relation Age of Onset  . Diabetes Mother   . Cancer Mother   . Sickle cell anemia Father   . Alcohol abuse Father   . Colon cancer Father   . Diabetes Maternal Grandfather     Allergies  Allergen Reactions  . Other Anaphylaxis  . Shellfish Allergy Swelling    Current Outpatient Medications on File Prior to Visit  Medication Sig Dispense Refill  . albuterol (PROVENTIL) (2.5 MG/3ML) 0.083% nebulizer solution Take 3 mLs (2.5 mg total) by nebulization every 6 (six) hours as  needed for wheezing or shortness of breath. 150 mL 1  . atorvastatin (LIPITOR) 10 MG tablet 1 tab po Monday, Wednesday and Friday. 36 tablet 3  . budesonide-formoterol (SYMBICORT) 160-4.5 MCG/ACT inhaler Inhale 2 puffs into the lungs 2 (two) times daily. 1 Inhaler 3  . DAYSEE 0.15-0.03 &0.01 MG tablet TAKE 1 TABLET BY MOUTH DAILY. 91 tablet 4  . fluticasone (FLONASE) 50 MCG/ACT nasal spray Place 2 sprays into both nostrils daily. 16 g 1  . glucose blood (ACCU-CHEK GUIDE) test strip 1 each by Other route 2 (two) times daily. And lancets 2/day 200 each 3  . hydrocortisone 2.5 % ointment Apply topically 2 (two) times daily. 30 g 0  . ipratropium-albuterol (DUONEB) 0.5-2.5 (3) MG/3ML SOLN Take 3 mLs by nebulization every 4 (four) hours as needed. 360 mL 0  . ketoconazole (NIZORAL) 2 % shampoo Apply 1 application topically 2 (two) times a week. 120 mL 1  . levocetirizine (XYZAL) 5 MG tablet Take 1 tablet (5 mg total) by mouth every evening. 30 tablet 6  . metFORMIN (GLUCOPHAGE) 500 MG tablet Take 1 tablet (500 mg total) by mouth 2 (two) times daily with a meal. 180 tablet 3  . montelukast (SINGULAIR) 10 MG tablet Take 1 tablet (10 mg total) by mouth at bedtime. 30 tablet 6  . pantoprazole (PROTONIX) 40 MG tablet Take 1 tablet (40 mg total) by mouth daily. 30 tablet 3  . phentermine 37.5 MG capsule Take 1 capsule (37.5 mg total) by mouth every morning. 30 capsule 0  . repaglinide (PRANDIN) 1 MG tablet Take 1 tablet (1 mg total) by mouth 3 (three) times daily before meals. 270 tablet 3  . Semaglutide, 1 MG/DOSE, (OZEMPIC, 1 MG/DOSE,) 2 MG/1.5ML SOPN Inject 1 mg into the skin once a week. 9 mL 3  . Spacer/Aero-Holding Chambers (AEROCHAMBER MV) inhaler Use as instructed 1 each 2  . VENTOLIN HFA 108 (90 Base) MCG/ACT inhaler Inhale 2 puffs into the lungs every 6 (six) hours as needed for wheezing or shortness of breath. 18 g 0  . empagliflozin (JARDIANCE) 10 MG TABS tablet Take 10 mg by mouth daily. (Patient  not taking: Reported on 02/01/2020) 90 tablet 3  . meloxicam (MOBIC) 15 MG tablet Take 1 tablet (15 mg total) by mouth daily. (Patient not taking: Reported on 02/01/2020) 14 tablet 0  . varenicline (CHANTIX STARTING MONTH PAK) 0.5 MG X 11 & 1 MG X 42 tablet Take one 0.5 mg tablet by mouth once daily for 3 days, then increase to one 0.5 mg tablet twice daily for 4 days, then increase to one 1 mg tablet twice daily. (Patient not taking: Reported on 02/01/2020) 53 tablet 0  No current facility-administered medications on file prior to visit.    BP 133/85   Pulse (!) 110   Temp 98.7 F (37.1 C) (Oral)   Resp 18   Ht 5\' 5"  (1.651 m)   Wt 298 lb (135.2 kg)   SpO2 95%   BMI 49.59 kg/m      Objective:   Physical Exam  General- No acute distress. Pleasant patient. Neck- Full range of motion, no jvd Appears to have goiter on palpation. Rt side appears thyroid appears larger than left side. Though both enlarged. Lungs- Clear, even and unlabored. Heart- regular rate and rhythm. Neurologic- CNII- XII grossly intact.       Assessment & Plan:  You have history of bipolar and currently stable mood presently.  I did refill your Lamictal and Wellbutrin.  Went ahead and place referral to behavioral health so he can establish care with new psychiatrist.  We will continue to refill psychiatric meds during the interim.  If your signs and symptoms worsen or change please let me know.  For obesity, continue healthy diet and try to get daily exercise.  We will go ahead and refer you to weight loss management clinic since you do report some challenges and side effects with the psych meds in terms of weight gain in the past.  For history of asthma recommend following through with the pulmonologist PFT studies.  Continue Symbicort.  For diabetes, continue meds Dr. prescribed.  For history of goiter, want you to go downstairs and see if you can get scheduled for ultrasound that Dr.Ellison  ordered.  Follow-up in 1 month or as needed.  Everardo All, PA-C

## 2020-02-01 NOTE — Patient Instructions (Signed)
You have history of bipolar and currently stable mood presently.  I did refill your Lamictal and Wellbutrin.  Went ahead and place referral to behavioral health so he can establish care with new psychiatrist.  We will continue to refill psychiatric meds during the interim.  If your signs and symptoms worsen or change please let me know.  For obesity, continue healthy diet and try to get daily exercise.  We will go ahead and refer you to weight loss management clinic since you do report some challenges and side effects with the psych meds in terms of weight gain in the past.  For history of asthma recommend following through with the pulmonologist PFT studies.  Continue Symbicort.  For diabetes, continue meds Dr. Everardo All prescribed.  For history of goiter, want you to go downstairs and see if you can get scheduled for ultrasound that Dr.Ellison ordered.  Follow-up in 1 month or as needed.

## 2020-02-02 ENCOUNTER — Ambulatory Visit: Payer: No Typology Code available for payment source | Admitting: Pulmonary Disease

## 2020-02-06 ENCOUNTER — Other Ambulatory Visit: Payer: Self-pay

## 2020-02-06 ENCOUNTER — Ambulatory Visit: Payer: No Typology Code available for payment source | Admitting: Pulmonary Disease

## 2020-02-06 ENCOUNTER — Other Ambulatory Visit: Payer: Self-pay | Admitting: Podiatry

## 2020-02-06 ENCOUNTER — Ambulatory Visit (INDEPENDENT_AMBULATORY_CARE_PROVIDER_SITE_OTHER): Payer: No Typology Code available for payment source | Admitting: Podiatry

## 2020-02-06 ENCOUNTER — Ambulatory Visit (INDEPENDENT_AMBULATORY_CARE_PROVIDER_SITE_OTHER): Payer: No Typology Code available for payment source

## 2020-02-06 DIAGNOSIS — M25571 Pain in right ankle and joints of right foot: Secondary | ICD-10-CM | POA: Diagnosis not present

## 2020-02-06 DIAGNOSIS — M19071 Primary osteoarthritis, right ankle and foot: Secondary | ICD-10-CM

## 2020-02-06 DIAGNOSIS — G8929 Other chronic pain: Secondary | ICD-10-CM

## 2020-02-06 DIAGNOSIS — M779 Enthesopathy, unspecified: Secondary | ICD-10-CM | POA: Diagnosis not present

## 2020-02-06 DIAGNOSIS — M7989 Other specified soft tissue disorders: Secondary | ICD-10-CM

## 2020-02-06 MED ORDER — MELOXICAM 15 MG PO TABS: 15.0000 mg | ORAL_TABLET | Freq: Every day | ORAL | 0 refills | Status: DC

## 2020-02-06 MED FILL — MELOXICAM 15 MG TABLET: 15 | 30 days supply | Qty: 30 | Fill #0

## 2020-02-06 NOTE — Patient Instructions (Signed)
Meloxicam tablets What is this medicine? MELOXICAM (mel OX i cam) is a non-steroidal anti-inflammatory drug (NSAID). It is used to reduce swelling and to treat pain. It may be used for osteoarthritis, rheumatoid arthritis, or juvenile rheumatoid arthritis. This medicine may be used for other purposes; ask your health care provider or pharmacist if you have questions. COMMON BRAND NAME(S): Mobic What should I tell my health care provider before I take this medicine? They need to know if you have any of these conditions:  bleeding disorders  cigarette smoker  coronary artery bypass graft (CABG) surgery within the past 2 weeks  drink more than 3 alcohol-containing drinks per day  heart disease  high blood pressure  history of stomach bleeding  kidney disease  liver disease  lung or breathing disease, like asthma  stomach or intestine problems  an unusual or allergic reaction to meloxicam, aspirin, other NSAIDs, other medicines, foods, dyes, or preservatives  pregnant or trying to get pregnant  breast-feeding How should I use this medicine? Take this medicine by mouth with a full glass of water. Follow the directions on the prescription label. You can take it with or without food. If it upsets your stomach, take it with food. Take your medicine at regular intervals. Do not take it more often than directed. Do not stop taking except on your doctor's advice. A special MedGuide will be given to you by the pharmacist with each prescription and refill. Be sure to read this information carefully each time. Talk to your pediatrician regarding the use of this medicine in children. While this drug may be prescribed for selected conditions, precautions do apply. Patients over 65 years old may have a stronger reaction and need a smaller dose. Overdosage: If you think you have taken too much of this medicine contact a poison control center or emergency room at once. NOTE: This medicine is  only for you. Do not share this medicine with others. What if I miss a dose? If you miss a dose, take it as soon as you can. If it is almost time for your next dose, take only that dose. Do not take double or extra doses. What may interact with this medicine? Do not take this medicine with any of the following medications:  cidofovir  ketorolac This medicine may also interact with the following medications:  aspirin and aspirin-like medicines  certain medicines for blood pressure, heart disease, irregular heart beat  certain medicines for depression, anxiety, or psychotic disturbances  certain medicines that treat or prevent blood clots like warfarin, enoxaparin, dalteparin, apixaban, dabigatran, rivaroxaban  cyclosporine  diuretics  fluconazole  lithium  methotrexate  other NSAIDs, medicines for pain and inflammation, like ibuprofen and naproxen  pemetrexed This list may not describe all possible interactions. Give your health care provider a list of all the medicines, herbs, non-prescription drugs, or dietary supplements you use. Also tell them if you smoke, drink alcohol, or use illegal drugs. Some items may interact with your medicine. What should I watch for while using this medicine? Tell your doctor or healthcare provider if your symptoms do not start to get better or if they get worse. This medicine may cause serious skin reactions. They can happen weeks to months after starting the medicine. Contact your healthcare provider right away if you notice fevers or flu-like symptoms with a rash. The rash may be red or purple and then turn into blisters or peeling of the skin. Or, you might notice a red rash with   swelling of the face, lips or lymph nodes in your neck or under your arms. Do not take other medicines that contain aspirin, ibuprofen, or naproxen with this medicine. Side effects such as stomach upset, nausea, or ulcers may be more likely to occur. Many medicines  available without a prescription should not be taken with this medicine. This medicine can cause ulcers and bleeding in the stomach and intestines at any time during treatment. This can happen with no warning and may cause death. There is increased risk with taking this medicine for a long time. Smoking, drinking alcohol, older age, and poor health can also increase risks. Call your doctor right away if you have stomach pain or blood in your vomit or stool. This medicine does not prevent heart attack or stroke. In fact, this medicine may increase the chance of a heart attack or stroke. The chance may increase with longer use of this medicine and in people who have heart disease. If you take aspirin to prevent heart attack or stroke, talk with your doctor or healthcare provider. What side effects may I notice from receiving this medicine? Side effects that you should report to your doctor or health care professional as soon as possible:  allergic reactions like skin rash, itching or hives, swelling of the face, lips, or tongue  nausea, vomiting  redness, blistering, peeling, or loosening of the skin, including inside the mouth  signs and symptoms of a blood clot such as breathing problems; changes in vision; chest pain; severe, sudden headache; pain, swelling, warmth in the leg; trouble speaking; sudden numbness or weakness of the face, arm, or leg  signs and symptoms of bleeding such as bloody or black, tarry stools; red or dark-brown urine; spitting up blood or brown material that looks like coffee grounds; red spots on the skin; unusual bruising or bleeding from the eye, gums, or nose  signs and symptoms of liver injury like dark yellow or brown urine; general ill feeling or flu-like symptoms; light-colored stools; loss of appetite; nausea; right upper belly pain; unusually weak or tired; yellowing of the eyes or skin  signs and symptoms of stroke like changes in vision; confusion; trouble  speaking or understanding; severe headaches; sudden numbness or weakness of the face, arm, or leg; trouble walking; dizziness; loss of balance or coordination Side effects that usually do not require medical attention (report to your doctor or health care professional if they continue or are bothersome):  constipation  diarrhea  gas This list may not describe all possible side effects. Call your doctor for medical advice about side effects. You may report side effects to FDA at 1-800-FDA-1088. Where should I keep my medicine? Keep out of the reach of children. Store at room temperature between 15 and 30 degrees C (59 and 86 degrees F). Throw away any unused medicine after the expiration date. NOTE: This sheet is a summary. It may not cover all possible information. If you have questions about this medicine, talk to your doctor, pharmacist, or health care provider.  2020 Elsevier/Gold Standard (2018-07-07 11:21:28)  

## 2020-02-07 NOTE — Progress Notes (Signed)
Subjective: 44 year old female presents the office for follow-up evaluation of right ankle, foot pain.  I have not seen her since January 2021 and since then she was having issues with insurance issues not able to get the MRI.  She presents today to reestablish and to reorder the MRI.  She states that she still having pain that she points along the area of the cyst, lipoma lateral ankle but she is also starting discomfort in the arch of the foot she points on the central aspect as well as to the forefoot area.  She denies any recent injury or falls.  She is walking a lot at work which aggravates her symptoms.  Her last A1c was 8.5 on 01/30/2020  Objective: AAO x3, NAD DP/PT pulses palpable bilaterally, CRT less than 3 seconds Soft tissue mass consistent with likely lipoma versus cyst to the lateral aspect of the ankle which causes discomfort.  She also has discomfort on the insertion of plantar fascial on the calcaneus on the arch of the foot as well.  Mild diffuse tenderness to the forefoot area but no specific area of pinpoint tenderness.  Significant decrease in medial arch upon weightbearing.  Flexor, extensor tendons appear to be intact.  MMT 5/5. No pain with calf compression, swelling, warmth, erythema  Assessment: Soft tissue lesion of the right lateral ankle; plan fasciitis  Plan: -All treatment options discussed with the patient including all alternatives, risks, complications.  -Repeat x-rays obtained reviewed.  Significant flattening the arch is identified with talonavicular uncoverage as well as increased calcaneocuboid angle and decreased calcaneal pitch angle. -At this point when I reorder the MRI this is for potential surgical planning of the right ankle to evaluate the soft tissue mass.  However prior to surgery she is to get her A1c under better control.  She states that she has been noncompliant with her medications. -Regards to the foot pain in general organ to have her  follow-up for orthotics.  Discussion with her modifications as well. -Continue stretching, icing daily.  Offered steroid injection today. -Prescribed mobic. Discussed side effects of the medication and directed to stop if any are to occur and call the office.   Katie Luna DPM

## 2020-02-13 ENCOUNTER — Encounter (INDEPENDENT_AMBULATORY_CARE_PROVIDER_SITE_OTHER): Payer: Self-pay

## 2020-02-23 ENCOUNTER — Other Ambulatory Visit: Payer: Self-pay | Admitting: Podiatry

## 2020-02-26 ENCOUNTER — Ambulatory Visit
Admission: RE | Admit: 2020-02-26 | Discharge: 2020-02-26 | Disposition: A | Discharge status: Home / Self Care | Payer: No Typology Code available for payment source | Source: Ambulatory Visit | Attending: Podiatry | Admitting: Podiatry

## 2020-02-26 DIAGNOSIS — M19071 Primary osteoarthritis, right ankle and foot: Secondary | ICD-10-CM

## 2020-02-26 DIAGNOSIS — G8929 Other chronic pain: Secondary | ICD-10-CM

## 2020-02-26 DIAGNOSIS — M7989 Other specified soft tissue disorders: Secondary | ICD-10-CM

## 2020-02-27 ENCOUNTER — Encounter: Payer: Self-pay | Admitting: Podiatry

## 2020-03-05 ENCOUNTER — Encounter: Payer: Self-pay | Admitting: Podiatry

## 2020-03-06 ENCOUNTER — Other Ambulatory Visit: Payer: Self-pay | Admitting: Podiatry

## 2020-03-06 MED ORDER — MELOXICAM 15 MG PO TABS: 15.0000 mg | ORAL_TABLET | Freq: Every day | ORAL | 0 refills | Status: DC

## 2020-03-06 MED FILL — MELOXICAM 15 MG TABLET: 15 | 30 days supply | Qty: 30 | Fill #0

## 2020-03-07 ENCOUNTER — Encounter: Payer: Self-pay | Admitting: Podiatry

## 2020-03-08 ENCOUNTER — Other Ambulatory Visit: Payer: Self-pay | Admitting: Podiatry

## 2020-03-08 MED ORDER — GABAPENTIN 100 MG PO CAPS: 100.0000 mg | ORAL_CAPSULE | Freq: Two times a day (BID) | ORAL | 0 refills | Status: DC

## 2020-03-08 MED FILL — GABAPENTIN 100 MG CAPSULE: 100 | 45 days supply | Qty: 90 | Fill #0

## 2020-03-19 ENCOUNTER — Ambulatory Visit: Payer: No Typology Code available for payment source | Admitting: Endocrinology

## 2020-04-16 ENCOUNTER — Telehealth: Payer: Self-pay | Admitting: Endocrinology

## 2020-04-16 ENCOUNTER — Telehealth: Payer: Self-pay

## 2020-04-16 ENCOUNTER — Other Ambulatory Visit: Payer: Self-pay | Admitting: Endocrinology

## 2020-04-16 DIAGNOSIS — E119 Type 2 diabetes mellitus without complications: Secondary | ICD-10-CM

## 2020-04-16 DIAGNOSIS — Z794 Long term (current) use of insulin: Secondary | ICD-10-CM

## 2020-04-16 MED ORDER — HUMALOG 100 UNIT/ML ~~LOC~~ SOCT: 6.0000 [IU] | Freq: Three times a day (TID) | SUBCUTANEOUS | 1 refills | Status: DC

## 2020-04-16 MED ORDER — INSULIN GLARGINE 100 UNIT/ML ~~LOC~~ SOLN: 10.0000 [IU] | Freq: Every day | SUBCUTANEOUS | 11 refills | Status: DC

## 2020-04-16 MED ORDER — FREESTYLE SYSTEM KIT: 1.0000 | PACK | 0 refills | Status: DC | PRN

## 2020-04-16 MED ORDER — TRESIBA 100 UNIT/ML ~~LOC~~ SOLN: SUBCUTANEOUS | 0 refills | Status: DC

## 2020-04-16 MED ORDER — PEN NEEDLES 32G X 5 MM MISC: 1 refills | Status: AC

## 2020-04-16 MED FILL — HUMALOG 100 UNITS/ML KWIKPE: 100 | 50 days supply | Qty: 3 | Fill #0

## 2020-04-16 MED FILL — ULTICARE PEN NDL 8MM 31G: 31G X 8 MM | 33 days supply | Qty: 100 | Fill #0

## 2020-04-16 MED FILL — TRESIBA 100 UNIT/ML SOLN: 100 | 56 days supply | Qty: 10 | Fill #0

## 2020-04-16 NOTE — Addendum Note (Signed)
Addended by: Kenyon Ana on: 04/16/2020 05:34 PM   Modules accepted: Orders

## 2020-04-16 NOTE — Telephone Encounter (Signed)
Humalog and Lantus are okay.  If she is not able to do an injection on her own she will need to be brought to the office to show her.  She will need to see me on Wednesday to follow-up.  Also needs to check blood sugars 4 times a day

## 2020-04-16 NOTE — Telephone Encounter (Signed)
Inbound call from pt advising she was not feeling well. Unsteady hands while writing eye twitching and spots in vision. Patient has not taken her medication today as she has not eaten yet. She states all she had was a handful of cough drops since last night. Patient checked her blood sugar and it was 415. Patient asking what can be done to help bring her sugar down.

## 2020-04-16 NOTE — Telephone Encounter (Signed)
Patients insurance does not cover NovoLog covered alternatives are Humalog and Lyumjev. Please advise which to send.

## 2020-04-16 NOTE — Telephone Encounter (Signed)
Called and advised patient Humalog and Lantus was sent to her preferred pharmacy. Patient advised she needed a follow up appointment scheduled 04/18/2020 at 8:15 with Dr Lucianne Muss. Patient verbalized understanding and confirmed appt time.

## 2020-04-16 NOTE — Telephone Encounter (Signed)
Pharmacy called stating they needed clarification for the Humalog Rx that was called in.  Please call [phone] Regulatory affairs officer)

## 2020-04-16 NOTE — Telephone Encounter (Signed)
Rx sent to preferred pharmacy for insulin, pen needles, and meter.

## 2020-04-16 NOTE — Telephone Encounter (Signed)
Please send Tresiba U-100 FlexPen.  Also make sure she has pen needles

## 2020-04-16 NOTE — Telephone Encounter (Signed)
I think you took care of this, can you talk to them?

## 2020-04-16 NOTE — Telephone Encounter (Signed)
The patients insurance does not cover Lantus, covered alternatives are Levemir, Basaglar, or [place]. Please advise which alternative is acceptable. Pt also requesting a freestyle meter be prescribed as well.

## 2020-04-16 NOTE — Telephone Encounter (Signed)
Need to know how long her sugars have been higher She would need to start taking insulin to bring her blood sugars down.  Please find out if Cristy Folks or Al Corpus can see her today She will need to take NovoLog insulin 6 units before each meal and anytime the blood sugar is over 350.  Also start Lantus insulin 10 units daily once a day.  Please send prescriptions However the symptoms mentioned are not related to high sugar and she needs to talk to her PCP also

## 2020-04-17 ENCOUNTER — Encounter: Payer: Self-pay | Admitting: Medical

## 2020-04-17 ENCOUNTER — Telehealth: Payer: Self-pay | Admitting: Medical

## 2020-04-17 DIAGNOSIS — F319 Bipolar disorder, unspecified: Secondary | ICD-10-CM

## 2020-04-17 MED ORDER — FREESTYLE LITE TEST VI STRP: ORAL_STRIP | 12 refills | Status: DC

## 2020-04-17 NOTE — Progress Notes (Signed)
Patient ID: Katie Luna, female   DOB: 1976-01-29, 44 y.o.   MRN: [id number]           Reason for Appointment: Type II Diabetes follow-up   History of Present Illness   Diagnosis date: 2010  Previous history:  She was diagnosed to have diabetes when she passed out and her blood sugar was likely over 500.  Initially started on insulin Subsequently has been on Metformin, GLP-1 drugs of different kinds Started on Jardiance in 11/2018  Recent history:      She was switched from mealtime Humalog insulin to Prandin and her Vania Rea was stopped at her last visit  She thinks that since then her blood sugars have been higher and she has had symptoms of blurred vision, increased thirst, dry mouth, feeling out of sorts and getting shaky  Because of her feeling shaky at times she mostly checks her blood sugar with the meter at work  She cannot operate her freestyle meter at home because of shakiness but appears to have checked it only in the last couple of days before meals and no prior blood sugars are available  Has not checked readings after meals or fasting today  Blood sugars in the last few days have been over 400 at times  She only takes Metformin in the evening as it causes cramps and diarrhea and usually will not take it in the morning because she wants to avoid the side effects at work  She was supposed to pick up her [place] prescription which was sent 2 days ago but she thinks it was not ready and her fasting glucose today is still nearly 300   Non-insulin hypoglycemic drugs: Metformin 500 daily-bid, Ozempic 1 mg weekly     Insulin regimen:   Humalog 6 units before meals          Side effects from medications: None  Exercise: None recently  Diet management: She will sometimes drink juice when she is thirsty, does not think she is following any meal plan and may not always eat healthy meals      Monitors blood glucose:  Irregularly using FreeStyle meter          Blood Glucose readings from meter download: Before dinner 216, 247, 10 AM = 290  Hypoglycemia:  none                        Dietician visit: Most recent:  years ago    Weight control:  Wt Readings from Last 3 Encounters:  04/18/20 297 lb 9.6 oz (135 kg)  02/01/20 298 lb (135.2 kg)  01/30/20 299 lb 6.4 oz (135.8 kg)          Complications:     Diabetes labs:  Lab Results  Component Value Date   HGBA1C 8.5 (A) 01/30/2020   HGBA1C 9.6 (H) 12/09/2019   HGBA1C 7.3 (A) 02/18/2019   Lab Results  Component Value Date   LDLCALC 93 12/09/2019   CREATININE 0.66 12/09/2019     Allergies as of 04/18/2020      Reactions   Other Anaphylaxis   Shellfish Allergy Swelling      Medication List       Accurate as of April 18, 2020  8:51 AM. If you have any questions, ask your nurse or doctor.        AeroChamber MV inhaler Use as instructed   albuterol (2.5 MG/3ML) 0.083% nebulizer solution Commonly known as: PROVENTIL Take  3 mLs (2.5 mg total) by nebulization every 6 (six) hours as needed for wheezing or shortness of breath.   Ventolin HFA 108 (90 Base) MCG/ACT inhaler Generic drug: albuterol Inhale 2 puffs into the lungs every 6 (six) hours as needed for wheezing or shortness of breath.   atorvastatin 10 MG tablet Commonly known as: LIPITOR 1 tab po Monday, Wednesday and Friday.   budesonide-formoterol 160-4.5 MCG/ACT inhaler Commonly known as: SYMBICORT Inhale 2 puffs into the lungs 2 (two) times daily.   buPROPion 150 MG 24 hr tablet Commonly known as: WELLBUTRIN XL Take 1 tablet (150 mg total) by mouth daily.   Chantix Starting Month Pak 0.5 MG X 11 & 1 MG X 42 tablet Generic drug: varenicline Take one 0.5 mg tablet by mouth once daily for 3 days, then increase to one 0.5 mg tablet twice daily for 4 days, then increase to one 1 mg tablet twice daily.   Daysee 0.15-0.03 &0.01 MG tablet Generic drug: Levonorgestrel-Ethinyl Estradiol TAKE 1 TABLET BY MOUTH  DAILY.   fluticasone 50 MCG/ACT nasal spray Commonly known as: FLONASE Place 2 sprays into both nostrils daily.   FreeStyle Libre 2 Sensor Misc 2 Devices by Does not apply route every 14 (fourteen) days. Started by: Elayne Snare, MD   FREESTYLE LITE test strip Generic drug: glucose blood Use as instructed   gabapentin 100 MG capsule Commonly known as: NEURONTIN Take 1 capsule (100 mg total) by mouth 2 (two) times daily.   glucose monitoring kit monitoring kit 1 each by Does not apply route as needed for other. Use to check sugar at least 4 times daily   HUMALOG KWIKPEN [place] Inject 6 Units into the skin.   HumaLOG 100 UNIT/ML cartridge Generic drug: insulin lispro Inject 0.06 mLs (6 Units total) into the skin 3 (three) times daily with meals.   hydrocortisone 2.5 % ointment Apply topically 2 (two) times daily.   ipratropium-albuterol 0.5-2.5 (3) MG/3ML Soln Commonly known as: DUONEB Take 3 mLs by nebulization every 4 (four) hours as needed.   Jardiance 10 MG Tabs tablet Generic drug: empagliflozin Take 10 mg by mouth daily.   ketoconazole 2 % shampoo Commonly known as: NIZORAL Apply 1 application topically 2 (two) times a week.   lamoTRIgine 200 MG tablet Commonly known as: LAMICTAL Take 1 tablet (200 mg total) by mouth daily.   levocetirizine 5 MG tablet Commonly known as: XYZAL Take 1 tablet (5 mg total) by mouth every evening.   meloxicam 15 MG tablet Commonly known as: MOBIC Take 1 tablet (15 mg total) by mouth daily.   meloxicam 15 MG tablet Commonly known as: MOBIC Take 1 tablet (15 mg total) by mouth daily.   metFORMIN 500 MG tablet Commonly known as: GLUCOPHAGE Take 1 tablet (500 mg total) by mouth 2 (two) times daily with a meal.   montelukast 10 MG tablet Commonly known as: SINGULAIR Take 1 tablet (10 mg total) by mouth at bedtime.   Ozempic (1 MG/DOSE) 2 MG/1.5ML Sopn Generic drug: Semaglutide (1 MG/DOSE) Inject 1 mg into the skin once a  week.   pantoprazole 40 MG tablet Commonly known as: PROTONIX Take 1 tablet (40 mg total) by mouth daily.   Pen Needles 32G X 5 MM Misc Use as instructed   phentermine 37.5 MG capsule Take 1 capsule (37.5 mg total) by mouth every morning.   repaglinide 1 MG tablet Commonly known as: PRANDIN Take 1 tablet (1 mg total) by mouth 3 (three) times daily  before meals.   Tresiba 100 UNIT/ML Soln Generic drug: Insulin [place] Inject 10 units into the skin daily       Allergies:  Allergies  Allergen Reactions  . Other Anaphylaxis  . Shellfish Allergy Swelling    Past Medical History:  Diagnosis Date  . Anemia   . Asthma 01/09/2020  . Diabetes mellitus without complication ([place])   . GERD (gastroesophageal reflux disease)   . Goiter 12/17/2018  . Polycystic ovary syndrome     Past Surgical History:  Procedure Laterality Date  . CHOLECYSTECTOMY      Family History  Problem Relation Age of Onset  . Diabetes Mother   . Cancer Mother   . Sickle cell anemia Father   . Alcohol abuse Father   . Colon cancer Father   . Diabetes Maternal Grandfather     Social History:  reports that she has quit smoking. Her smoking use included cigarettes. She has a 7.25 pack-year smoking history. She has never used smokeless tobacco. She reports current alcohol use. She reports that she does not use drugs.  Review of Systems:   Lipids: Treated with Lipitor 10 mg    LABS:  Office Visit on 04/18/2020  Component Date Value Ref Range Status  . POC Glucose 04/18/2020 296* 70 - 99 mg/dl Final     Examination:   BP 128/82   Pulse 95   Ht '5\' 5"'  (1.651 m)   Wt 297 lb 9.6 oz (135 kg)   SpO2 97%   BMI 49.52 kg/m   Body mass index is 49.52 kg/m.    ASSESSMENT/ PLAN:    Diabetes type 2 with morbid obesity:   Blood glucose control has been poor with A1c consistently high She has very little knowledge of self-care, meal planning, blood sugar monitoring Currently blood sugars are  quite out of control and she appears insulin deficient Discussed that she likely has been partially insulin deficient since her diagnosis when her blood sugar was over 600  She is not able to tolerate Metformin and likely taking only 1 tablet in the evening daily Also blood sugar may be higher from stopping Jardiance recently With current dose of 6 units of Humalog for the last couple of days her blood sugars are still over 200 and without any basal insulin fasting glucose is 296  Plan as below  She was given a titration table to adjust her Tresiba dose based on fasting readings every 3 days by 2 units  Likely needs higher doses of Humalog with meals for now with correction of 1: 50  Discussed needing to take Humalog before starting to eat consistently  Consultation with the dietitian will be necessary  She was also given information on the freestyle libre sensor and if this is covered she can start using this, she will let us know if she needs any help starting this, she can use the smart phone for the reader  If unable to get that she will use the Accu-Chek meter that can be prescribed  Discussed blood sugar targets before and after meals and when to check the blood sugars   Patient instructions:  Humalog 10 units before meals For every 50 points over 150 add 2 more U  Tresiba 18 units daily as directed  Stop Metformin and Prandin  Restart Jardiance when sugar <200   Check blood sugars on waking up 7 days a week  Also check blood sugars about 2 hours after meals and do this after different  meals by rotation  Recommended blood sugar levels on waking up are 90-130 and about 2 hours after meal is 130-160  Please bring your blood sugar monitor to each visit, thank you    Patient Instructions  Humalog 10 units before meals For every 50 points over 150 add 2 more U  Tresiba 18 units daily as directed  Stop Metformin and Prandin  Restart Jardiance when sugar <200    Check blood sugars on waking up 7 days a week  Also check blood sugars about 2 hours after meals and do this after different meals by rotation  Recommended blood sugar levels on waking up are 90-130 and about 2 hours after meal is 130-160  Please bring your blood sugar monitor to each visit, thank you      Elayne Snare 04/18/2020, 8:51 AM

## 2020-04-17 NOTE — Telephone Encounter (Signed)
Referral to psychiatry placed.

## 2020-04-18 ENCOUNTER — Encounter: Payer: Self-pay | Admitting: Endocrinology

## 2020-04-18 ENCOUNTER — Other Ambulatory Visit: Payer: Self-pay | Admitting: *Deleted

## 2020-04-18 ENCOUNTER — Other Ambulatory Visit: Payer: Self-pay | Admitting: Endocrinology

## 2020-04-18 ENCOUNTER — Ambulatory Visit (INDEPENDENT_AMBULATORY_CARE_PROVIDER_SITE_OTHER): Payer: No Typology Code available for payment source | Admitting: Endocrinology

## 2020-04-18 ENCOUNTER — Other Ambulatory Visit: Payer: Self-pay

## 2020-04-18 VITALS — BP 128/82 | HR 95 | Ht 65.0 in | Wt 297.6 lb

## 2020-04-18 DIAGNOSIS — Z794 Long term (current) use of insulin: Secondary | ICD-10-CM | POA: Diagnosis not present

## 2020-04-18 DIAGNOSIS — E1165 Type 2 diabetes mellitus with hyperglycemia: Secondary | ICD-10-CM

## 2020-04-18 LAB — GLUCOSE, POCT (MANUAL RESULT ENTRY): POC Glucose: 296 mg/dl — AB (ref 70–99)

## 2020-04-18 MED ORDER — FREESTYLE LIBRE 2 SENSOR MISC: 2.0000 | 3 refills | Status: DC

## 2020-04-18 MED ORDER — ACCU-CHEK GUIDE ME W/DEVICE KIT: PACK | 0 refills | Status: AC

## 2020-04-18 MED ORDER — ACCU-CHEK GUIDE VI STRP: ORAL_STRIP | 0 refills | Status: AC

## 2020-04-18 MED ORDER — INSULIN LISPRO (1 UNIT DIAL) 100 UNIT/ML (KWIKPEN): 10.0000 [IU] | PEN_INJECTOR | Freq: Three times a day (TID) | SUBCUTANEOUS | 0 refills | Status: DC

## 2020-04-18 MED ORDER — ACCU-CHEK SOFTCLIX LANCETS MISC
0 refills | Status: AC
Start: 2020-04-18 — End: ?

## 2020-04-18 NOTE — Patient Instructions (Addendum)
Humalog 10 units before meals For every 50 points over 150 add 2 more U  Tresiba 18 units daily as directed  Stop Metformin and Prandin  Restart Jardiance when sugar <200   Check blood sugars on waking up 7 days a week  Also check blood sugars about 2 hours after meals and do this after different meals by rotation  Recommended blood sugar levels on waking up are 90-130 and about 2 hours after meal is 130-160  Please bring your blood sugar monitor to each visit, thank you

## 2020-04-26 ENCOUNTER — Other Ambulatory Visit: Payer: Self-pay

## 2020-04-26 ENCOUNTER — Inpatient Hospital Stay: Payer: No Typology Code available for payment source | Attending: Medical

## 2020-04-26 DIAGNOSIS — Z23 Encounter for immunization: Secondary | ICD-10-CM | POA: Diagnosis present

## 2020-04-26 MED FILL — FREESTYLE LITE TEST STRIP: 25 days supply | Qty: 100 | Fill #0

## 2020-04-26 NOTE — Progress Notes (Signed)
   Covid-19 Vaccination Clinic  Name:  VERNEE BAINES    MRN: [id number] DOB: 03-07-1976  04/26/2020  Ms. Phang was observed post Covid-19 immunization for 15 minutes without incident. She was provided with Vaccine Information Sheet and instruction to access the V-Safe system.   Ms. Fiser was instructed to call 911 with any severe reactions post vaccine: Marland Kitchen Difficulty breathing  . Swelling of face and throat  . A fast heartbeat  . A bad rash all over body  . Dizziness and weakness   Immunizations Administered    Name Date Dose VIS Date Route   Pfizer COVID-19 Vaccine 04/26/2020  4:05 PM 0.3 mL 02/08/2020 Intramuscular   Manufacturer: [employer], [employer]   Lot: [id number]   NDC: [address]-2

## 2020-04-27 ENCOUNTER — Telehealth: Payer: Self-pay | Admitting: *Deleted

## 2020-04-27 NOTE — Telephone Encounter (Signed)
Started PA--[id number]--FREESTYLE LIBRE 2 SENSOR

## 2020-05-03 ENCOUNTER — Encounter: Payer: Self-pay | Admitting: Medical

## 2020-05-08 ENCOUNTER — Ambulatory Visit: Payer: No Typology Code available for payment source | Admitting: Endocrinology

## 2020-05-10 MED FILL — LANTUS 100 UNITS/ML VIAL: 100 | 90 days supply | Qty: 10 | Fill #0

## 2020-05-10 MED FILL — MONTELUKAST SOD 10 MG TAB: 10 | 30 days supply | Qty: 30 | Fill #0

## 2020-05-10 MED FILL — LEVOCETIRIZINE 5 MG TABLET: 5 | 30 days supply | Qty: 30 | Fill #0

## 2020-05-13 ENCOUNTER — Encounter (INDEPENDENT_AMBULATORY_CARE_PROVIDER_SITE_OTHER): Payer: Self-pay

## 2020-05-31 ENCOUNTER — Telehealth: Payer: Self-pay | Admitting: Medical

## 2020-05-31 NOTE — Telephone Encounter (Signed)
Refilled pt wellbutrin.

## 2020-06-19 ENCOUNTER — Encounter: Payer: Self-pay | Admitting: Endocrinology

## 2020-07-09 NOTE — Telephone Encounter (Signed)
Dr. Isaiah Serge, please see mychart message sent by pt and advise:  To: [facility] POOL    From: Katie Luna    Created: 07/08/2020 9:07 PM     *-*-*This message was handled on 07/09/2020 11:26 AM by [name] P*-*-*  I choked a couple days ago and im thinking I may have pna i am heaving and breathless with a cough that different from my normal dry cough  I do have a tendency y to choke quite often and have had aspiration pna several times im not sure if this is the issue but something is different I can feel something different going on here I've also been sweating. Alot it could be a allergy or asthma I do have copd so im not sure but I wanted to reach out because im little concerned  [place]

## 2020-07-10 ENCOUNTER — Telehealth: Payer: Self-pay | Admitting: *Deleted

## 2020-07-10 NOTE — Telephone Encounter (Signed)
ATC patient to schedule an appointment with TP as recommended by Dr. Isaiah Serge.  She has not seen a physician since she saw Dr. Kendrick Fries.  Advised patient to call the office to schedule an OV.

## 2020-07-10 NOTE — Telephone Encounter (Signed)
I have not seen this patient before.  She used to follow-up with Dr. Kendrick Fries and has not established with a new doctor.  It would be better if she came in for a visit

## 2020-07-11 ENCOUNTER — Encounter: Payer: Self-pay | Admitting: Endocrinology

## 2020-07-12 ENCOUNTER — Ambulatory Visit: Payer: No Typology Code available for payment source | Admitting: Dietician

## 2020-07-13 ENCOUNTER — Telehealth: Payer: Self-pay

## 2020-07-13 ENCOUNTER — Telehealth: Payer: Self-pay | Admitting: Endocrinology

## 2020-07-13 NOTE — Telephone Encounter (Signed)
LVM to schedule f/u

## 2020-07-13 NOTE — Telephone Encounter (Signed)
Please contact pt to schedule a F/U appt to discuss her Freestyle Libre per [place].  Thank you,  Christy Gentles

## 2020-07-17 ENCOUNTER — Other Ambulatory Visit: Payer: Self-pay | Admitting: Podiatry

## 2020-07-17 ENCOUNTER — Other Ambulatory Visit: Payer: Self-pay | Admitting: Endocrinology

## 2020-07-17 DIAGNOSIS — Z794 Long term (current) use of insulin: Secondary | ICD-10-CM

## 2020-07-17 DIAGNOSIS — E119 Type 2 diabetes mellitus without complications: Secondary | ICD-10-CM

## 2020-07-17 MED FILL — TRESIBA 100 UNIT/ML SOLN: 100 | 56 days supply | Qty: 10 | Fill #0

## 2020-07-17 MED FILL — GABAPENTIN 100 MG CAPSULE: 100 | 45 days supply | Qty: 90 | Fill #0

## 2020-07-17 MED FILL — LAMOTRIGINE 200 MG TABS: 200 | 90 days supply | Qty: 90 | Fill #1

## 2020-07-17 MED FILL — LEVOCETIRIZINE 5 MG TABLET: 5 | 30 days supply | Qty: 30 | Fill #1

## 2020-07-17 MED FILL — HUMALOG 100 UNITS/ML KWIKPE: 100 | 30 days supply | Qty: 15 | Fill #0

## 2020-07-17 MED FILL — MELOXICAM 15 MG TABLET: 15 | 30 days supply | Qty: 30 | Fill #0

## 2020-07-17 MED FILL — MONTELUKAST SOD 10 MG TAB: 10 | 30 days supply | Qty: 30 | Fill #1

## 2020-07-17 MED FILL — OZEMPIC (1 MG/DOSE) 4 MG/3M: 4 | 84 days supply | Qty: 9 | Fill #1

## 2020-07-17 MED FILL — buPROPion HCL ER (XL) 150 M: 150 | 30 days supply | Qty: 30 | Fill #1

## 2020-07-21 ENCOUNTER — Other Ambulatory Visit (HOSPITAL_BASED_OUTPATIENT_CLINIC_OR_DEPARTMENT_OTHER): Payer: Self-pay

## 2020-07-24 NOTE — Telephone Encounter (Signed)
error 

## 2020-07-27 ENCOUNTER — Other Ambulatory Visit: Payer: Self-pay

## 2020-07-27 ENCOUNTER — Other Ambulatory Visit (HOSPITAL_BASED_OUTPATIENT_CLINIC_OR_DEPARTMENT_OTHER): Payer: Self-pay

## 2020-07-27 ENCOUNTER — Ambulatory Visit: Payer: No Typology Code available for payment source | Admitting: Endocrinology

## 2020-07-27 VITALS — BP 132/82 | HR 97 | Ht 65.0 in | Wt 298.2 lb

## 2020-07-27 DIAGNOSIS — E119 Type 2 diabetes mellitus without complications: Secondary | ICD-10-CM

## 2020-07-27 DIAGNOSIS — Z794 Long term (current) use of insulin: Secondary | ICD-10-CM | POA: Diagnosis not present

## 2020-07-27 LAB — POCT GLYCOSYLATED HEMOGLOBIN (HGB A1C): Hemoglobin A1C: 11.8 % — AB (ref 4.0–5.6)

## 2020-07-27 MED ORDER — TRESIBA FLEXTOUCH 200 UNIT/ML ~~LOC~~ SOPN
30.0000 [IU] | PEN_INJECTOR | Freq: Every day | SUBCUTANEOUS | 3 refills | Status: DC
  Filled 2020-07-27 – 2020-08-08 (×2): qty 12, 80d supply, fill #0

## 2020-07-27 MED ORDER — DEXCOM G6 TRANSMITTER MISC
1.0000 | Freq: Once | 1 refills | Status: DC
  Filled 2020-07-27 – 2020-08-08 (×2): qty 1, 90d supply, fill #0
  Filled 2020-08-08: qty 1, 1d supply, fill #0
  Filled 2020-11-26: qty 1, 90d supply, fill #1

## 2020-07-27 MED ORDER — DEXCOM G6 SENSOR MISC
1.0000 | 3 refills | Status: DC
  Filled 2020-07-27: qty 3, 30d supply, fill #0
  Filled 2020-08-08: qty 9, 90d supply, fill #0
  Filled 2020-11-02: qty 9, 90d supply, fill #1
  Filled 2021-01-30: qty 9, 90d supply, fill #2

## 2020-07-27 MED ORDER — DEXCOM G6 RECEIVER DEVI
1.0000 | Freq: Once | 1 refills | Status: AC
  Filled 2020-07-27: qty 1, 90d supply, fill #0
  Filled 2020-08-08: qty 1, 1d supply, fill #0

## 2020-07-27 NOTE — Progress Notes (Signed)
Subjective:    Patient ID: Katie Luna, female    DOB: 02/01/1976, 45 y.o.   MRN: [id number]  HPI Pt returns for f/u of diabetes mellitus:  DM type: Insulin-requiring type 2 Dx'ed: 6503 Complications: none Therapy: insulin since 2018, Ozempic and 2 oral meds.   GDM: 1998 DKA: never Severe hypoglycemia: once, in 2004.  Pancreatitis: several episode of GB pancreatitis (none since cholecystect in 1998).   Pancreatic imaging: no result available to [place].  Other: she did not tolerate Bydureon (nodules at injections sites); she cannot take pioglitazone, due to edema; she took insulin 2018-2021  Interval history: no cbg record, but states cbg's vary from 197-527.  She just resumed Ozempic, after being off x 1 month.   Past Medical History:  Diagnosis Date  . Anemia   . Asthma 01/09/2020  . Diabetes mellitus without complication ([place])   . GERD (gastroesophageal reflux disease)   . Goiter 12/17/2018  . Polycystic ovary syndrome     Past Surgical History:  Procedure Laterality Date  . CHOLECYSTECTOMY      Social History   Socioeconomic History  . Marital status: Married    Spouse name: Not on file  . Number of children: 1  . Years of education: LPN  . Highest education level: Not on file  Occupational History  . Not on file  Tobacco Use  . Smoking status: Former Smoker    Packs/day: 0.25    Years: 29.00    Pack years: 7.25    Types: Cigarettes  . Smokeless tobacco: Never Used  Vaping Use  . Vaping Use: Never used  Substance and Sexual Activity  . Alcohol use: Yes    Comment: very rare infrequent. occaisional glass of wine.  . Drug use: No  . Sexual activity: Yes  Other Topics Concern  . Not on file  Social History Narrative  . Not on file   Social Determinants of Health   Financial Resource Strain: Not on file  Food Insecurity: Not on file  Transportation Needs: Not on file  Physical Activity: Not on file  Stress: Not on file  Social Connections: Not on  file  Intimate Partner Violence: Not on file    Current Outpatient Medications on File Prior to Visit  Medication Sig Dispense Refill  . Accu-Chek Softclix Lancets lancets Use as instructed to check blood sugar 3 times a day 100 each 0  . albuterol (PROVENTIL) (2.5 MG/3ML) 0.083% nebulizer solution Take 3 mLs (2.5 mg total) by nebulization every 6 (six) hours as needed for wheezing or shortness of breath. 150 mL 1  . atorvastatin (LIPITOR) 10 MG tablet 1 tab po Monday, Wednesday and Friday. 36 tablet 3  . Blood Glucose Monitoring Suppl (ACCU-CHEK GUIDE ME) w/Device KIT 1Use to check blood sugar 3 times per day 1 kit 0  . budesonide-formoterol (SYMBICORT) 160-4.5 MCG/ACT inhaler Inhale 2 puffs into the lungs 2 (two) times daily. 1 Inhaler 3  . buPROPion (WELLBUTRIN XL) 150 MG 24 hr tablet TAKE 1 TABLET (150 MG TOTAL) BY MOUTH DAILY. 30 tablet 11  . Continuous Blood Gluc Sensor (FREESTYLE LIBRE 2 SENSOR) MISC 2 Devices by Does not apply route every 14 (fourteen) days. 2 each 3  . DAYSEE 0.15-0.03 &0.01 MG tablet TAKE 1 TABLET BY MOUTH DAILY. 91 tablet 4  . empagliflozin (JARDIANCE) 10 MG TABS tablet Take 10 mg by mouth daily. 90 tablet 3  . fluticasone (FLONASE) 50 MCG/ACT nasal spray Place 2 sprays into both nostrils  daily. 16 g 1  . gabapentin (NEURONTIN) 100 MG capsule TAKE 1 CAPSULE (100 MG TOTAL) BY MOUTH 2 (TWO) TIMES DAILY. 90 capsule 0  . glucose blood (ACCU-CHEK GUIDE) test strip Use as instructed to check blood sugar 3 times per day 100 each 0  . hydrocortisone 2.5 % ointment Apply topically 2 (two) times daily. 30 g 0  . insulin lispro (HUMALOG) 100 UNIT/ML KwikPen INJECT 10-16 UNITS INTO THE SKIN WITH BREAKFAST, WITH LUNCH, AND WITH EVENING MEAL. 15 mL 0  . insulin lispro (HUMALOG) 100 UNIT/ML KwikPen INJECT 6 UNITS INTO THE SKIN 3 TIMES DAILY WITH MEALS 15 mL 1  . Insulin Pen Needle (PEN NEEDLES) 32G X 5 MM MISC Use as instructed 100 each 1  . Insulin Pen Needle 31G X 8 MM MISC USE  AS DIRECTED 3 TIMES DAILY 100 each 1  . ipratropium-albuterol (DUONEB) 0.5-2.5 (3) MG/3ML SOLN Take 3 mLs by nebulization every 4 (four) hours as needed. 360 mL 0  . ketoconazole (NIZORAL) 2 % shampoo Apply 1 application topically 2 (two) times a week. 120 mL 1  . lamoTRIgine (LAMICTAL) 200 MG tablet TAKE 1 TABLET (200 MG TOTAL) BY MOUTH DAILY. 90 tablet 3  . levocetirizine (XYZAL) 5 MG tablet TAKE 1 TABLET BY MOUTH EVERY EVENING 30 tablet 6  . meloxicam (MOBIC) 15 MG tablet TAKE 1 TABLET (15 MG TOTAL) BY MOUTH DAILY. 30 tablet 0  . metFORMIN (GLUCOPHAGE) 500 MG tablet Take 1 tablet (500 mg total) by mouth 2 (two) times daily with a meal. 180 tablet 3  . montelukast (SINGULAIR) 10 MG tablet TAKE 1 TABLET BY MOUTH EVERY NIGHT AT BEDTIME 30 tablet 6  . pantoprazole (PROTONIX) 40 MG tablet Take 1 tablet (40 mg total) by mouth daily. 30 tablet 3  . phentermine 37.5 MG capsule Take 1 capsule (37.5 mg total) by mouth every morning. 30 capsule 0  . Semaglutide, 1 MG/DOSE, (OZEMPIC, 1 MG/DOSE,) 2 MG/1.5ML SOPN Inject 1 mg into the skin once a week. 9 mL 3  . Spacer/Aero-Holding Chambers (AEROCHAMBER MV) inhaler Use as instructed 1 each 2  . varenicline (CHANTIX STARTING MONTH PAK) 0.5 MG X 11 & 1 MG X 42 tablet Take one 0.5 mg tablet by mouth once daily for 3 days, then increase to one 0.5 mg tablet twice daily for 4 days, then increase to one 1 mg tablet twice daily. 53 tablet 0  . VENTOLIN HFA 108 (90 Base) MCG/ACT inhaler Inhale 2 puffs into the lungs every 6 (six) hours as needed for wheezing or shortness of breath. 18 g 0  . [DISCONTINUED] insulin glargine (LANTUS) 100 UNIT/ML injection Inject 0.1 mLs (10 Units total) into the skin daily. 10 mL 11   No current facility-administered medications on file prior to visit.    Allergies  Allergen Reactions  . Other Anaphylaxis  . Shellfish Allergy Swelling    Family History  Problem Relation Age of Onset  . Diabetes Mother   . Cancer Mother   .  Sickle cell anemia Father   . Alcohol abuse Father   . Colon cancer Father   . Diabetes Maternal Grandfather     BP 132/82 (BP Location: Right Arm, Patient Position: Sitting, Cuff Size: Large)   Pulse 97   Ht '5\' 5"'  (1.651 m)   Wt 298 lb 3.2 oz (135.3 kg)   SpO2 99%   BMI 49.62 kg/m    Review of Systems     Objective:   Physical Exam  VITAL SIGNS:  See vs page GENERAL: no distress Pulses: dorsalis pedis intact bilat.   MSK: no deformity of the feet CV: trace bilat leg edema Skin:  no ulcer on the feet.  normal color and temp on the feet. Neuro: sensation is intact to touch on the feet.   Ext: there is bilateral onychomycosis of the toenails.     Lab Results  Component Value Date   HGBA1C 11.8 (A) 07/27/2020       Assessment & Plan:  Insulin-requiring type 2 DM: uncontrolled.   Patient Instructions  Please increase the Tresiba to 30 units per day, and you can go off the repaglinide.   Please continue the same other diabetes medications.   I have sent a prescription to your pharmacy, for the continuous glucose monitor.   check your blood sugar once a day.  vary the time of day when you check, between before the 3 meals, and at bedtime.  also check if you have symptoms of your blood sugar being too high or too low.  please keep a record of the readings and bring it to your next appointment here (or you can bring the meter itself).  You can write it on any piece of paper.  please call us sooner if your blood sugar goes below 70, or if you have a lot of readings over 200.   Please come back for a follow-up appointment in 6 weeks.

## 2020-07-27 NOTE — Patient Instructions (Addendum)
Please increase the Tresiba to 30 units per day, and you can go off the repaglinide.   Please continue the same other diabetes medications.   I have sent a prescription to your pharmacy, for the continuous glucose monitor.   check your blood sugar once a day.  vary the time of day when you check, between before the 3 meals, and at bedtime.  also check if you have symptoms of your blood sugar being too high or too low.  please keep a record of the readings and bring it to your next appointment here (or you can bring the meter itself).  You can write it on any piece of paper.  please call us sooner if your blood sugar goes below 70, or if you have a lot of readings over 200.   Please come back for a follow-up appointment in 6 weeks.

## 2020-07-31 ENCOUNTER — Other Ambulatory Visit (HOSPITAL_BASED_OUTPATIENT_CLINIC_OR_DEPARTMENT_OTHER): Payer: Self-pay

## 2020-08-03 ENCOUNTER — Other Ambulatory Visit (HOSPITAL_BASED_OUTPATIENT_CLINIC_OR_DEPARTMENT_OTHER): Payer: Self-pay

## 2020-08-08 ENCOUNTER — Other Ambulatory Visit (HOSPITAL_BASED_OUTPATIENT_CLINIC_OR_DEPARTMENT_OTHER): Payer: Self-pay

## 2020-08-18 ENCOUNTER — Telehealth: Payer: Self-pay | Admitting: Endocrinology

## 2020-08-27 ENCOUNTER — Encounter: Payer: Self-pay | Admitting: Dietician

## 2020-08-27 ENCOUNTER — Other Ambulatory Visit: Payer: Self-pay

## 2020-08-27 ENCOUNTER — Encounter: Payer: No Typology Code available for payment source | Attending: Endocrinology | Admitting: Dietician

## 2020-08-27 DIAGNOSIS — E119 Type 2 diabetes mellitus without complications: Secondary | ICD-10-CM | POA: Insufficient documentation

## 2020-08-27 DIAGNOSIS — Z794 Long term (current) use of insulin: Secondary | ICD-10-CM

## 2020-08-27 NOTE — Progress Notes (Signed)
Diabetes Self-Management Education  Visit Type: First/Initial  Appt. Start Time: 1545 Appt. End Time: 1715  08/27/2020  Ms. Katie Luna, identified by name and date of birth, is a 45 y.o. female with a diagnosis of Diabetes: Type 2.   ASSESSMENT Patient is here today alone. She states that her high blood sugar problem is self neglect.  States when her blood sugar is out of control she gets vision changes, uncontrolled yeast injections, and worse menstrual cycles. Has 2 jobs and works and gets no sleep on some days.  When she is not at work she is unable to relax frequently.  She was connected with a psychiatrist in the past.  She is now drinking the [employer] rather than soda at times. Aiming to drink 1 gallon of water daily.  She drinks increased amounts of calories and carbohydrates.  History of Type 2 Diabetes (2004) insulin (2018), smoking, asthma, PCOS A1C 11.8% 07/27/20 increased from 8.5% 01/30/2020.  [employer] has given her feedback for decision making as well as disciple to avoid the high alarms. Sleep:  0-8 hours (due to working 2 jobs)  Lost 31 lbs on Phentermine in the past but now avoids as she will not eat at work when she takes this and then is ravenous after work. She [facility] on [address].  They prescribe the Phentermine and limited nutrition guidance.    Patient lives with her husband and 35 yo daughter.  They share the shopping and cooking.  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 They are building a home. 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   She verbalized a need to follow up with mental health. She moved from [place].  She is trying to get herBachelor's degree. She had a 63 yo cousin who died post Bariatric Surgery.  Height 5\' 5"  (1.651 m), weight 298 lb (135.2 kg). Body mass index is 49.59 kg/m.   Diabetes Self-Management Education - 08/27/20 1633       Visit Information   Visit Type First/Initial      Initial Visit   Diabetes Type Type 2    Are you currently following a meal plan? No    Are you taking your medications as prescribed? Yes    Date Diagnosed 2004      Health Coping   How would you rate your overall health? Fair      Psychosocial Assessment   Patient Belief/Attitude about Diabetes Motivated to manage diabetes    Self-care barriers None    Self-management support Doctor's office;Family    Other persons present Patient    Patient Concerns Nutrition/Meal planning    Special Needs None    Preferred Learning Style No preference indicated    Learning Readiness Ready    How often do you need to have someone help you when you read instructions, pamphlets, or other written materials from your doctor or pharmacy? 1 - Never    What is the last grade level you completed in school? 2 years college      Pre-Education Assessment   Patient understands the diabetes disease and treatment process. Needs Instruction    Patient understands incorporating nutritional management into lifestyle. Needs Instruction    Patient undertands incorporating physical activity into lifestyle. Needs Instruction    Patient understands using medications safely. Needs Instruction    Patient understands monitoring blood glucose, interpreting and using  results Needs Instruction    Patient understands prevention, detection, and treatment of acute complications. Needs Instruction    Patient understands prevention, detection, and treatment of chronic complications. Needs Instruction    Patient understands how to develop strategies to address psychosocial issues. Needs Instruction    Patient understands how to develop strategies to promote health/change behavior. Needs Instruction      Complications   Last HgB A1C per patient/outside source 11.8 %   08/06/2020 increased from 8.5% 01/30/2020   How often do you check your blood sugar? > 4 times/day     Fasting Blood glucose range (mg/dL) 94-174    Postprandial Blood glucose range (mg/dL) 081-448;185-631    Number of hypoglycemic episodes per month 0    Have you had a dilated eye exam in the past 12 months? Yes    Have you had a dental exam in the past 12 months? No   "terrified of the dentist"   Are you checking your feet? Yes    How many days per week are you checking your feet? 7      Dietary Intake   Breakfast energy drink    Lunch salmon, cabbage, onions, bacon, corn (from coworker today)  OR crackers or chips or yogurt or soup    Dinner 4 packs of cheese and crackers    Snack (evening) 2 puddings, orange or 1 cup grapefruit, trail mix, pretzels, cheetos   2 am   Beverage(s) water, regular energy drink, [employer] iced coffeewith sweetened creamer and vanilla syrup, juice, regular soda, [place] seltzer water, diet tea      Exercise   Exercise Type ADL's    How many days per week to you exercise? 0    How many minutes per day do you exercise? 0    Total minutes per week of exercise 0      Patient Education   Previous Diabetes Education No    Disease state  Other (comment)   insulin resistance   Nutrition management  Food label reading, portion sizes and measuring food.;Reviewed blood glucose goals for pre and post meals and how to evaluate the patients' food intake on their blood glucose level.;Meal options for control of blood glucose level and chronic complications.    Physical activity and exercise  Role of exercise on diabetes management, blood pressure control and cardiac health.    Medications Reviewed patients medication for diabetes, action, purpose, timing of dose and side effects.    Monitoring Identified appropriate SMBG and/or A1C goals.    Psychosocial adjustment Worked with patient to identify barriers to care and solutions;Role of stress on diabetes;Identified and addressed patients feelings and concerns about diabetes;Brainstormed with patient on coping mechanisms for  social situations, getting support from significant others, dealing with feelings about diabetes    Personal strategies to promote health Review risk of smoking and offered smoking cessation      Individualized Goals (developed by patient)   Nutrition General guidelines for healthy choices and portions discussed    Physical Activity Not Applicable   at this time   Medications take my medication as prescribed    Monitoring  test my blood glucose as discussed;Other (comment)    Reducing Risk examine blood glucose patterns;increase portions of healthy fats;stop smoking    Health Coping discuss diabetes with (comment)   MD, CDCES, RD, counselor     Post-Education Assessment   Patient understands the diabetes disease and treatment process. Demonstrates understanding / competency    Patient  understands incorporating nutritional management into lifestyle. Needs Review    Patient undertands incorporating physical activity into lifestyle. Needs Review    Patient understands using medications safely. Demonstrates understanding / competency    Patient understands monitoring blood glucose, interpreting and using results Demonstrates understanding / competency    Patient understands prevention, detection, and treatment of acute complications. Demonstrates understanding / competency    Patient understands prevention, detection, and treatment of chronic complications. Demonstrates understanding / competency    Patient understands how to develop strategies to address psychosocial issues. Needs Review    Patient understands how to develop strategies to promote health/change behavior. Needs Review      Outcomes   Expected Outcomes Demonstrated interest in learning. Expect positive outcomes    Future DMSE 4-6 wks    Program Status Not Completed           Individualized Plan for Diabetes Self-Management Training:   Learning Objective:  Patient will have a greater understanding of diabetes  self-management. Patient education plan is to attend individual and/or group sessions per assessed needs and concerns.   Plan:   Patient Instructions  Self care! Consider counseling   Tips to stop smoking: Consider making your car a no smoke zone.  Clean your car and deodorize.  Change you habits - walk after work or do something else to decrease stress such as listen to a book on your way home.  Consider reading labels  2000 mg or less sodium per day  50 grams fat or less per day  45 grams carbs per meal  15 grams carbs per snack  Rethink your drink.  Sugar containing beverages and juice will increase your thirst as they increase your blood sugar.  Take a break for meals.  Eat sitting down.  Mindful decision  Eat slowly     Expected Outcomes:  Demonstrated interest in learning. Expect positive outcomes  Education material provided: ADA - How to Thrive: A Guide for Your Journey with Diabetes, Food label handouts and Snack sheet  If problems or questions, patient to contact team via:  Phone  Future DSME appointment: 4-6 wks

## 2020-08-27 NOTE — Patient Instructions (Addendum)
Self care! Consider counseling   Tips to stop smoking: Consider making your car a no smoke zone.  Clean your car and deodorize.  Change you habits - walk after work or do something else to decrease stress such as listen to a book on your way home.  Consider reading labels  2000 mg or less sodium per day  50 grams fat or less per day  45 grams carbs per meal  15 grams carbs per snack  Rethink your drink.  Sugar containing beverages and juice will increase your thirst as they increase your blood sugar.  Take a break for meals.  Eat sitting down.  Mindful decision  Eat slowly

## 2020-09-06 ENCOUNTER — Other Ambulatory Visit (HOSPITAL_BASED_OUTPATIENT_CLINIC_OR_DEPARTMENT_OTHER): Payer: Self-pay

## 2020-09-06 ENCOUNTER — Other Ambulatory Visit: Payer: Self-pay

## 2020-09-06 ENCOUNTER — Ambulatory Visit: Payer: No Typology Code available for payment source | Admitting: Endocrinology

## 2020-09-06 VITALS — BP 130/80 | HR 109 | Ht 65.0 in | Wt 298.4 lb

## 2020-09-06 DIAGNOSIS — Z794 Long term (current) use of insulin: Secondary | ICD-10-CM

## 2020-09-06 DIAGNOSIS — E119 Type 2 diabetes mellitus without complications: Secondary | ICD-10-CM

## 2020-09-06 MED ORDER — OZEMPIC (2 MG/DOSE) 8 MG/3ML ~~LOC~~ SOPN
2.0000 mg | PEN_INJECTOR | SUBCUTANEOUS | 3 refills | Status: DC
  Filled 2020-09-06: qty 9, 84d supply, fill #0
  Filled 2021-01-30: qty 9, 84d supply, fill #1
  Filled 2021-02-18 (×2): qty 9, 84d supply, fill #2

## 2020-09-06 NOTE — Progress Notes (Signed)
Subjective:    Patient ID: Katie Luna, female    DOB: 12-20-1975, 45 y.o.   MRN: [id number]  HPI Pt returns for f/u of diabetes mellitus:  DM type: Insulin-requiring type 2 Dx'ed: 2263 Complications: none Therapy: insulin since 2018, Ozempic and 2 oral meds.   GDM: 1998 DKA: never Severe hypoglycemia: once, in 2004.  Pancreatitis: several episode of GB pancreatitis (none since cholecystect in 1998).   Pancreatic imaging: no result available to [place].  Other: she did not tolerate Bydureon (nodules at injections sites); she cannot take pioglitazone, due to edema.   Interval history: I reviewed continuous glucose monitor data.  Glucose varies from 150-250.  It is in general highest at 9AM-2PM, and 8PM-10PM.  She takes meds as rx'ed.   Past Medical History:  Diagnosis Date  . Anemia   . Asthma 01/09/2020  . Diabetes mellitus without complication ([place])   . GERD (gastroesophageal reflux disease)   . Goiter 12/17/2018  . Polycystic ovary syndrome     Past Surgical History:  Procedure Laterality Date  . CHOLECYSTECTOMY      Social History   Socioeconomic History  . Marital status: Married    Spouse name: Not on file  . Number of children: 1  . Years of education: LPN  . Highest education level: Not on file  Occupational History  . Not on file  Tobacco Use  . Smoking status: Current Every Day Smoker    Packs/day: 0.25    Years: 29.00    Pack years: 7.25    Types: Cigarettes  . Smokeless tobacco: Never Used  Vaping Use  . Vaping Use: Never used  Substance and Sexual Activity  . Alcohol use: Yes    Comment: very rare infrequent. occaisional glass of wine.  . Drug use: No  . Sexual activity: Yes  Other Topics Concern  . Not on file  Social History Narrative  . Not on file   Social Determinants of Health   Financial Resource Strain: Not on file  Food Insecurity: Not on file  Transportation Needs: Not on file  Physical Activity: Not on file  Stress: Not on file   Social Connections: Not on file  Intimate Partner Violence: Not on file    Current Outpatient Medications on File Prior to Visit  Medication Sig Dispense Refill  . Accu-Chek Softclix Lancets lancets Use as instructed to check blood sugar 3 times a day 100 each 0  . albuterol (PROVENTIL) (2.5 MG/3ML) 0.083% nebulizer solution Take 3 mLs (2.5 mg total) by nebulization every 6 (six) hours as needed for wheezing or shortness of breath. 150 mL 1  . atorvastatin (LIPITOR) 10 MG tablet 1 tab po Monday, Wednesday and Friday. 36 tablet 3  . Blood Glucose Monitoring Suppl (ACCU-CHEK GUIDE ME) w/Device KIT 1Use to check blood sugar 3 times per day 1 kit 0  . budesonide-formoterol (SYMBICORT) 160-4.5 MCG/ACT inhaler Inhale 2 puffs into the lungs 2 (two) times daily. 1 Inhaler 3  . buPROPion (WELLBUTRIN XL) 150 MG 24 hr tablet TAKE 1 TABLET (150 MG TOTAL) BY MOUTH DAILY. 30 tablet 11  . Continuous Blood Gluc Sensor (DEXCOM G6 SENSOR) MISC USE AS DIRECTED EVERY 10 DAYS 9 each 3  . DAYSEE 0.15-0.03 &0.01 MG tablet TAKE 1 TABLET BY MOUTH DAILY. 91 tablet 4  . empagliflozin (JARDIANCE) 10 MG TABS tablet Take 10 mg by mouth daily. 90 tablet 3  . fluticasone (FLONASE) 50 MCG/ACT nasal spray Place 2 sprays into both nostrils  daily. 16 g 1  . gabapentin (NEURONTIN) 100 MG capsule TAKE 1 CAPSULE (100 MG TOTAL) BY MOUTH 2 (TWO) TIMES DAILY. 90 capsule 0  . glucose blood (ACCU-CHEK GUIDE) test strip Use as instructed to check blood sugar 3 times per day 100 each 0  . hydrocortisone 2.5 % ointment Apply topically 2 (two) times daily. 30 g 0  . insulin degludec (TRESIBA FLEXTOUCH) 200 UNIT/ML FlexTouch Pen Inject 30 Units into the skin daily. 30 mL 3  . insulin lispro (HUMALOG) 100 UNIT/ML KwikPen INJECT 6 UNITS INTO THE SKIN 3 TIMES DAILY WITH MEALS 15 mL 1  . Insulin Pen Needle (PEN NEEDLES) 32G X 5 MM MISC Use as instructed 100 each 1  . Insulin Pen Needle 31G X 8 MM MISC USE AS DIRECTED 3 TIMES DAILY 100 each 1   . ipratropium-albuterol (DUONEB) 0.5-2.5 (3) MG/3ML SOLN Take 3 mLs by nebulization every 4 (four) hours as needed. 360 mL 0  . ketoconazole (NIZORAL) 2 % shampoo Apply 1 application topically 2 (two) times a week. 120 mL 1  . lamoTRIgine (LAMICTAL) 200 MG tablet TAKE 1 TABLET (200 MG TOTAL) BY MOUTH DAILY. 90 tablet 3  . levocetirizine (XYZAL) 5 MG tablet TAKE 1 TABLET BY MOUTH EVERY EVENING 30 tablet 6  . meloxicam (MOBIC) 15 MG tablet TAKE 1 TABLET (15 MG TOTAL) BY MOUTH DAILY. 30 tablet 0  . metFORMIN (GLUCOPHAGE) 500 MG tablet Take 1 tablet (500 mg total) by mouth 2 (two) times daily with a meal. 180 tablet 3  . montelukast (SINGULAIR) 10 MG tablet TAKE 1 TABLET BY MOUTH EVERY NIGHT AT BEDTIME 30 tablet 6  . pantoprazole (PROTONIX) 40 MG tablet Take 1 tablet (40 mg total) by mouth daily. 30 tablet 3  . phentermine 37.5 MG capsule Take 1 capsule (37.5 mg total) by mouth every morning. 30 capsule 0  . Spacer/Aero-Holding Chambers (AEROCHAMBER MV) inhaler Use as instructed 1 each 2  . varenicline (CHANTIX STARTING MONTH PAK) 0.5 MG X 11 & 1 MG X 42 tablet Take one 0.5 mg tablet by mouth once daily for 3 days, then increase to one 0.5 mg tablet twice daily for 4 days, then increase to one 1 mg tablet twice daily. 53 tablet 0  . VENTOLIN HFA 108 (90 Base) MCG/ACT inhaler Inhale 2 puffs into the lungs every 6 (six) hours as needed for wheezing or shortness of breath. 18 g 0  . [DISCONTINUED] insulin glargine (LANTUS) 100 UNIT/ML injection Inject 0.1 mLs (10 Units total) into the skin daily. 10 mL 11   No current facility-administered medications on file prior to visit.    Allergies  Allergen Reactions  . Other Anaphylaxis  . Shellfish Allergy Swelling    Family History  Problem Relation Age of Onset  . Diabetes Mother   . Cancer Mother   . Sickle cell anemia Father   . Alcohol abuse Father   . Colon cancer Father   . Diabetes Maternal Grandfather     BP 130/80 (BP Location: Right  Arm, Patient Position: Sitting, Cuff Size: Large)   Pulse (!) 109   Ht '5\' 5"'  (1.651 m)   Wt 298 lb 6.4 oz (135.4 kg)   SpO2 96%   BMI 49.66 kg/m    Review of Systems She denies hypoglycemia/n/v.    Objective:   Physical Exam VITAL SIGNS:  See vs page GENERAL: no distress Pulses: dorsalis pedis intact bilat.   MSK: no deformity of the feet CV: no leg  edema Skin:  no ulcer on the feet.  normal color and temp on the feet. Neuro: sensation is intact to touch on the feet   Lab Results  Component Value Date   HGBA1C 11.8 (A) 07/27/2020       Assessment & Plan:  Insulin-requiring type 2 DM: uncontrolled.   Patient Instructions  Please increase the Ozempic to 2 mg weekly Please continue the same other diabetes medications.   check your blood sugar once a day.  vary the time of day when you check, between before the 3 meals, and at bedtime.  also check if you have symptoms of your blood sugar being too high or too low.  please keep a record of the readings and bring it to your next appointment here (or you can bring the meter itself).  You can write it on any piece of paper.  please call us sooner if your blood sugar goes below 70, or if you have a lot of readings over 200.   Please come back for a follow-up appointment in 6 weeks.

## 2020-09-06 NOTE — Patient Instructions (Addendum)
Please increase the Ozempic to 2 mg weekly Please continue the same other diabetes medications.   check your blood sugar once a day.  vary the time of day when you check, between before the 3 meals, and at bedtime.  also check if you have symptoms of your blood sugar being too high or too low.  please keep a record of the readings and bring it to your next appointment here (or you can bring the meter itself).  You can write it on any piece of paper.  please call us sooner if your blood sugar goes below 70, or if you have a lot of readings over 200.   Please come back for a follow-up appointment in 6 weeks.

## 2020-09-07 ENCOUNTER — Other Ambulatory Visit (HOSPITAL_BASED_OUTPATIENT_CLINIC_OR_DEPARTMENT_OTHER): Payer: Self-pay

## 2020-09-25 ENCOUNTER — Ambulatory Visit: Payer: No Typology Code available for payment source | Admitting: Dietician

## 2020-10-03 ENCOUNTER — Other Ambulatory Visit: Payer: Self-pay | Admitting: Medical

## 2020-10-03 NOTE — Telephone Encounter (Addendum)
Patient comment: I have not had this for a while but I have noticed the patches in creases of my face and edges of scalp again and it flakes in eyebrows I would like to use this again because it clears it up nicely and also takes away the itch.   Refill sent to pt pharmacy. If rash persists past this prescription let me know.

## 2020-10-07 MED ORDER — HYDROCORTISONE 2.5 % EX OINT
TOPICAL_OINTMENT | Freq: Two times a day (BID) | CUTANEOUS | 0 refills | Status: AC
  Filled 2020-10-07: qty 28.35, 30d supply, fill #0

## 2020-10-08 ENCOUNTER — Other Ambulatory Visit (HOSPITAL_BASED_OUTPATIENT_CLINIC_OR_DEPARTMENT_OTHER): Payer: Self-pay

## 2020-10-29 ENCOUNTER — Ambulatory Visit: Payer: No Typology Code available for payment source | Admitting: Endocrinology

## 2020-11-02 ENCOUNTER — Other Ambulatory Visit (HOSPITAL_BASED_OUTPATIENT_CLINIC_OR_DEPARTMENT_OTHER): Payer: Self-pay

## 2020-11-02 MED FILL — Bupropion HCl Tab ER 24HR 150 MG: ORAL | 30 days supply | Qty: 30 | Fill #0 | Status: AC

## 2020-11-02 MED FILL — Montelukast Sodium Tab 10 MG (Base Equiv): ORAL | 30 days supply | Qty: 30 | Fill #0 | Status: AC

## 2020-11-08 ENCOUNTER — Other Ambulatory Visit (HOSPITAL_COMMUNITY): Payer: Self-pay

## 2020-11-26 ENCOUNTER — Other Ambulatory Visit (HOSPITAL_BASED_OUTPATIENT_CLINIC_OR_DEPARTMENT_OTHER): Payer: Self-pay

## 2021-01-30 MED FILL — Bupropion HCl Tab ER 24HR 150 MG: ORAL | 30 days supply | Qty: 30 | Fill #1 | Status: AC

## 2021-01-30 MED FILL — Lamotrigine Tab 200 MG: ORAL | 90 days supply | Qty: 90 | Fill #0 | Status: AC

## 2021-01-31 ENCOUNTER — Other Ambulatory Visit (HOSPITAL_BASED_OUTPATIENT_CLINIC_OR_DEPARTMENT_OTHER): Payer: Self-pay

## 2021-02-01 ENCOUNTER — Other Ambulatory Visit (HOSPITAL_BASED_OUTPATIENT_CLINIC_OR_DEPARTMENT_OTHER): Payer: Self-pay

## 2021-02-05 ENCOUNTER — Other Ambulatory Visit (HOSPITAL_BASED_OUTPATIENT_CLINIC_OR_DEPARTMENT_OTHER): Payer: Self-pay

## 2021-02-18 ENCOUNTER — Other Ambulatory Visit (HOSPITAL_BASED_OUTPATIENT_CLINIC_OR_DEPARTMENT_OTHER): Payer: Self-pay

## 2021-02-18 ENCOUNTER — Other Ambulatory Visit: Payer: Self-pay | Admitting: Endocrinology

## 2021-02-18 MED ORDER — DEXCOM G6 TRANSMITTER MISC
1.0000 | Freq: Once | 1 refills | Status: AC
  Filled 2021-02-18: qty 1, 90d supply, fill #0

## 2021-02-22 ENCOUNTER — Other Ambulatory Visit (HOSPITAL_BASED_OUTPATIENT_CLINIC_OR_DEPARTMENT_OTHER): Payer: Self-pay

## 2021-07-22 ENCOUNTER — Encounter: Payer: Self-pay | Admitting: Medical

## 2021-07-22 NOTE — Telephone Encounter (Signed)
Patient called back and was scheduled to see Dr. Laury Axon in am tomorrow ?

## 2021-07-23 ENCOUNTER — Ambulatory Visit (INDEPENDENT_AMBULATORY_CARE_PROVIDER_SITE_OTHER): Payer: 59 | Admitting: Family Medicine

## 2021-07-23 ENCOUNTER — Encounter: Payer: Self-pay | Admitting: Family Medicine

## 2021-07-23 ENCOUNTER — Other Ambulatory Visit (HOSPITAL_BASED_OUTPATIENT_CLINIC_OR_DEPARTMENT_OTHER): Payer: Self-pay

## 2021-07-23 VITALS — BP 110/78 | HR 98 | Temp 98.7°F | Resp 20 | Ht 65.0 in | Wt 288.2 lb

## 2021-07-23 DIAGNOSIS — L0291 Cutaneous abscess, unspecified: Secondary | ICD-10-CM | POA: Diagnosis not present

## 2021-07-23 MED ORDER — CEFTRIAXONE SODIUM 1 G IJ SOLR
1.0000 g | Freq: Once | INTRAMUSCULAR | Status: AC
  Administered 2021-07-23: 1 g via INTRAMUSCULAR

## 2021-07-23 MED ORDER — DOXYCYCLINE HYCLATE 100 MG PO TABS
100.0000 mg | ORAL_TABLET | Freq: Two times a day (BID) | ORAL | 0 refills | Status: AC
  Filled 2021-07-23: qty 20, 10d supply, fill #0

## 2021-07-23 NOTE — Progress Notes (Signed)
? ?Subjective:  ? ?By signing my name below, I, Shehryar Baig, attest that this documentation has been prepared under the direction and in the presence of Ann Held, DO  07/23/2021 ?   ? ? Patient ID: Katie Luna, female    DOB: 1975-10-08, 46 y.o.   MRN: [id number] ? ?Chief Complaint  ?Patient presents with  ?? Abscess  ?  Pt states the abscess popped last night twice. Pt states still having pain and states having nausea and fatigue. Pt states taking some leftover two amoxicillin and two clindamycin. Pt only had two days of antibiotics.    ? ? ?Abscess ?Pertinent negatives include no abdominal pain, chest pain, congestion, fever, headaches, nausea or rash.  ? ?Patient is in today for a office visit.  ? ?She complains of a painful swelling near her right groin. She started experiencing pain last Friday, 07/19/2021 and found she had a bump in her left groin. Her pain has increased and the bump has grown since then. She found it popped Sunday morning and was draining. She denies having any fever. She is experiencing more fatigue than usual but things it may be due to another reason. She does not check her blood sugar regularly and does not know it at this time.  ? ? ?Past Medical History:  ?Diagnosis Date  ?? Anemia   ?? Asthma 01/09/2020  ?? Diabetes mellitus without complication ([place])   ?? GERD (gastroesophageal reflux disease)   ?? Goiter 12/17/2018  ?? Polycystic ovary syndrome   ? ? ?Past Surgical History:  ?Procedure Laterality Date  ?? CHOLECYSTECTOMY    ? ? ?Family History  ?Problem Relation Age of Onset  ?? Diabetes Mother   ?? Cancer Mother   ?? Sickle cell anemia Father   ?? Alcohol abuse Father   ?? Colon cancer Father   ?? Diabetes Maternal Grandfather   ? ? ?Social History  ? ?Socioeconomic History  ?? Marital status: Married  ?  Spouse name: Not on file  ?? Number of children: 1  ?? Years of education: LPN  ?? Highest education level: Not on file  ?Occupational History  ?? Not on file   ?Tobacco Use  ?? Smoking status: Every Day  ?  Packs/day: 0.25  ?  Years: 29.00  ?  Pack years: 7.25  ?  Types: Cigarettes  ?? Smokeless tobacco: Never  ?Vaping Use  ?? Vaping Use: Never used  ?Substance and Sexual Activity  ?? Alcohol use: Yes  ?  Comment: very rare infrequent. occaisional glass of wine.  ?? Drug use: No  ?? Sexual activity: Yes  ?Other Topics Concern  ?? Not on file  ?Social History Narrative  ?? Not on file  ? ?Social Determinants of Health  ? ?Financial Resource Strain: Not on file  ?Food Insecurity: Not on file  ?Transportation Needs: Not on file  ?Physical Activity: Not on file  ?Stress: Not on file  ?Social Connections: Not on file  ?Intimate Partner Violence: Not on file  ? ? ?Outpatient Medications Prior to Visit  ?Medication Sig Dispense Refill  ?? Accu-Chek Softclix Lancets lancets Use as instructed to check blood sugar 3 times a day 100 each 0  ?? albuterol (PROVENTIL) (2.5 MG/3ML) 0.083% nebulizer solution Take 3 mLs (2.5 mg total) by nebulization every 6 (six) hours as needed for wheezing or shortness of breath. 150 mL 1  ?? atorvastatin (LIPITOR) 10 MG tablet 1 tab po Monday, Wednesday and Friday. 36 tablet 3  ??  Blood Glucose Monitoring Suppl (ACCU-CHEK GUIDE ME) w/Device KIT 1Use to check blood sugar 3 times per day 1 kit 0  ?? budesonide-formoterol (SYMBICORT) 160-4.5 MCG/ACT inhaler Inhale 2 puffs into the lungs 2 (two) times daily. 1 Inhaler 3  ?? Continuous Blood Gluc Sensor (DEXCOM G6 SENSOR) MISC USE AS DIRECTED EVERY 10 DAYS 9 each 3  ?? DAYSEE 0.15-0.03 &0.01 MG tablet TAKE 1 TABLET BY MOUTH DAILY. 91 tablet 4  ?? empagliflozin (JARDIANCE) 10 MG TABS tablet Take 10 mg by mouth daily. 90 tablet 3  ?? fluticasone (FLONASE) 50 MCG/ACT nasal spray Place 2 sprays into both nostrils daily. 16 g 1  ?? glucose blood (ACCU-CHEK GUIDE) test strip Use as instructed to check blood sugar 3 times per day 100 each 0  ?? hydrocortisone 2.5 % ointment Apply on to the skin 2 (two) times  daily. 28.35 g 0  ?? insulin degludec (TRESIBA FLEXTOUCH) 200 UNIT/ML FlexTouch Pen Inject 30 Units into the skin daily. 30 mL 3  ?? Insulin Pen Needle (PEN NEEDLES) 32G X 5 MM MISC Use as instructed 100 each 1  ?? ipratropium-albuterol (DUONEB) 0.5-2.5 (3) MG/3ML SOLN Take 3 mLs by nebulization every 4 (four) hours as needed. 360 mL 0  ?? ketoconazole (NIZORAL) 2 % shampoo Apply 1 application topically 2 (two) times a week. 120 mL 1  ?? metFORMIN (GLUCOPHAGE) 500 MG tablet Take 1 tablet (500 mg total) by mouth 2 (two) times daily with a meal. 180 tablet 3  ?? pantoprazole (PROTONIX) 40 MG tablet Take 1 tablet (40 mg total) by mouth daily. 30 tablet 3  ?? phentermine 37.5 MG capsule Take 1 capsule (37.5 mg total) by mouth every morning. 30 capsule 0  ?? Semaglutide, 2 MG/DOSE, (OZEMPIC, 2 MG/DOSE,) 8 MG/3ML SOPN Inject 2 mg into the skin once a week. 9 mL 3  ?? Spacer/Aero-Holding Chambers (AEROCHAMBER MV) inhaler Use as instructed 1 each 2  ?? varenicline (CHANTIX STARTING MONTH PAK) 0.5 MG X 11 & 1 MG X 42 tablet Take one 0.5 mg tablet by mouth once daily for 3 days, then increase to one 0.5 mg tablet twice daily for 4 days, then increase to one 1 mg tablet twice daily. 53 tablet 0  ?? VENTOLIN HFA 108 (90 Base) MCG/ACT inhaler Inhale 2 puffs into the lungs every 6 (six) hours as needed for wheezing or shortness of breath. 18 g 0  ?? buPROPion (WELLBUTRIN XL) 150 MG 24 hr tablet TAKE 1 TABLET (150 MG TOTAL) BY MOUTH DAILY. 30 tablet 11  ?? gabapentin (NEURONTIN) 100 MG capsule TAKE 1 CAPSULE (100 MG TOTAL) BY MOUTH 2 (TWO) TIMES DAILY. 90 capsule 0  ?? insulin lispro (HUMALOG) 100 UNIT/ML KwikPen INJECT 6 UNITS INTO THE SKIN 3 TIMES DAILY WITH MEALS 15 mL 1  ?? lamoTRIgine (LAMICTAL) 200 MG tablet TAKE 1 TABLET (200 MG TOTAL) BY MOUTH DAILY. 90 tablet 3  ?? levocetirizine (XYZAL) 5 MG tablet TAKE 1 TABLET BY MOUTH EVERY EVENING 30 tablet 6  ?? montelukast (SINGULAIR) 10 MG tablet TAKE 1 TABLET BY MOUTH EVERY NIGHT  AT BEDTIME 30 tablet 6  ? ?No facility-administered medications prior to visit.  ? ? ?Allergies  ?Allergen Reactions  ?? Other Anaphylaxis  ?? Shellfish Allergy Swelling  ? ? ?Review of Systems  ?Constitutional:  Negative for fever and malaise/fatigue.  ?HENT:  Negative for congestion.   ?Eyes:  Negative for blurred vision.  ?Respiratory:  Negative for shortness of breath.   ?Cardiovascular:  Negative  for chest pain, palpitations and leg swelling.  ?Gastrointestinal:  Negative for abdominal pain, blood in stool and nausea.  ?Genitourinary:  Negative for dysuria and frequency.  ?Musculoskeletal:  Negative for falls.  ?Skin:  Negative for rash.  ?     (+)painful abscess on left groin ?(+)drainage when abscess popped  ?Neurological:  Negative for dizziness, loss of consciousness and headaches.  ?Endo/Heme/Allergies:  Negative for environmental allergies.  ?Psychiatric/Behavioral:  Negative for depression. The patient is not nervous/anxious.   ? ?   ?Objective:  ?  ?Physical Exam ?Constitutional:   ?   General: She is not in acute distress. ?   Appearance: Normal appearance. She is not ill-appearing.  ?HENT:  ?   Head: Normocephalic and atraumatic.  ?   Right Ear: External ear normal.  ?   Left Ear: External ear normal.  ?Eyes:  ?   Extraocular Movements: Extraocular movements intact.  ?   Pupils: Pupils are equal, round, and reactive to light.  ?Cardiovascular:  ?   Rate and Rhythm: Normal rate and regular rhythm.  ?   Heart sounds: Normal heart sounds. No murmur heard. ?  No gallop.  ?Pulmonary:  ?   Effort: Pulmonary effort is normal. No respiratory distress.  ?   Breath sounds: Normal breath sounds. No wheezing or rales.  ?Skin: ?   General: Skin is warm and dry.  ?   Comments: Right groin showed hole present with draining.  ?Area is Tender, fluctuant.  + cont to drain serous fluid  ?Area cleaned and abx ointment placed with guaze ?  ?Neurological:  ?   Mental Status: She is alert and oriented to person, place, and  time.  ?Psychiatric:     ?   Judgment: Judgment normal.  ? ? ?BP 110/78 (BP Location: Left Arm, Patient Position: Sitting, Cuff Size: Large)   Pulse 98   Temp 98.7 ?F (37.1 ?C) (Oral)   Resp 20   Ht '5\' 5"'  (1

## 2021-07-23 NOTE — Patient Instructions (Signed)
Skin Abscess  A skin abscess is an infected area on or under your skin that contains a collection of pus and other material. An abscess may also be called a furuncle,carbuncle, or boil. An abscess can occur in or on almost any part of your body. Some abscesses break open (rupture) on their own. Most continue to get worse unless they are treated. The infection can spread deeper into the body and eventually into your blood, whichcan make you feel ill. Treatment usually involves draining the abscess. What are the causes? An abscess occurs when germs, like bacteria, pass through your skin and cause an infection. This may be caused by: A scrape or cut on your skin. A puncture wound through your skin, including a needle injection or insect bite. Blocked oil or sweat glands. Blocked and infected hair follicles. A cyst that forms beneath your skin (sebaceous cyst) and becomes infected. What increases the risk? This condition is more likely to develop in people who: Have a weak body defense system (immune system). Have diabetes. Have dry and irritated skin. Get frequent injections or use illegal IV drugs. Have a foreign body in a wound, such as a splinter. Have problems with their lymph system or veins. What are the signs or symptoms? Symptoms of this condition include: A painful, firm bump under the skin. A bump with pus at the top. This may break through the skin and drain. Other symptoms include: Redness surrounding the abscess site. Warmth. Swelling of the lymph nodes (glands) near the abscess. Tenderness. A sore on the skin. How is this diagnosed? This condition may be diagnosed based on: A physical exam. Your medical history. A sample of pus. This may be used to find out what is causing the infection. Blood tests. Imaging tests, such as an ultrasound, CT scan, or MRI. How is this treated? A small abscess that drains on its own may not need treatment. Treatment for larger abscesses  may include: Moist heat or heat pack applied to the area several times a day. A procedure to drain the abscess (incision and drainage). Antibiotic medicines. For a severe abscess, you may first get antibiotics through an IV and then change to antibiotics by mouth. Follow these instructions at home: Medicines  Take over-the-counter and prescription medicines only as told by your health care provider. If you were prescribed an antibiotic medicine, take it as told by your health care provider. Do not stop taking the antibiotic even if you start to feel better.  Abscess care  If you have an abscess that has not drained, apply heat to the affected area. Use the heat source that your health care provider recommends, such as a moist heat pack or a heating pad. Place a towel between your skin and the heat source. Leave the heat on for 20-30 minutes. Remove the heat if your skin turns bright red. This is especially important if you are unable to feel pain, heat, or cold. You may have a greater risk of getting burned. Follow instructions from your health care provider about how to take care of your abscess. Make sure you: Cover the abscess with a bandage (dressing). Change your dressing or gauze as told by your health care provider. Wash your hands with soap and water before you change the dressing or gauze. If soap and water are not available, use hand sanitizer. Check your abscess every day for signs of a worsening infection. Check for: More redness, swelling, or pain. More fluid or blood. Warmth. More   pus or a bad smell.  General instructions To avoid spreading the infection: Do not share personal care items, towels, or hot tubs with others. Avoid making skin contact with other people. Keep all follow-up visits as told by your health care provider. This is important. Contact a health care provider if you have: More redness, swelling, or pain around your abscess. More fluid or blood coming  from your abscess. Warm skin around your abscess. More pus or a bad smell coming from your abscess. A fever. Muscle aches. Chills or a general ill feeling. Get help right away if you: Have severe pain. See red streaks on your skin spreading away from the abscess. Summary A skin abscess is an infected area on or under your skin that contains a collection of pus and other material. A small abscess that drains on its own may not need treatment. Treatment for larger abscesses may include having a procedure to drain the abscess and taking an antibiotic. This information is not intended to replace advice given to you by your health care provider. Make sure you discuss any questions you have with your healthcare provider. Document Revised: 07/29/2018 Document Reviewed: 05/21/2017 Elsevier Patient Education  2022 Elsevier Inc.  

## 2021-07-23 NOTE — Assessment & Plan Note (Signed)
Area cleaned  ?abx ointment placed with guaze  ?Rocephin 1 gm IM ?Doxycycline x 10 days  ?F/u pcp  ?If worsens she may need to see surgeon  ?

## 2021-07-30 ENCOUNTER — Telehealth: Payer: Self-pay | Admitting: Endocrinology

## 2021-07-30 NOTE — Telephone Encounter (Signed)
MEDICATION: Humalog, Evaristo Bury, Ozempic, and Dexcom ? ?PHARMACY:  [facility] on [address] in [place] ? ?HAS THE PATIENT CONTACTED THEIR PHARMACY?  no ? ?IS THIS A 90 DAY SUPPLY : YES ? ?IS PATIENT OUT OF MEDICATION: YES ? ?IF NOT; HOW MUCH IS LEFT:  ? ?LAST APPOINTMENT DATE: @10 /31/2022 ? ?NEXT APPOINTMENT DATE: no open appts available currently ? ?DO WE HAVE YOUR PERMISSION TO LEAVE A DETAILED MESSAGE?: yes - [phone] ? ?

## 2021-07-31 ENCOUNTER — Other Ambulatory Visit: Payer: Self-pay

## 2021-07-31 MED ORDER — OZEMPIC (2 MG/DOSE) 8 MG/3ML ~~LOC~~ SOPN: 2.0000 mg | PEN_INJECTOR | SUBCUTANEOUS | 0 refills | Status: DC

## 2021-07-31 MED ORDER — DEXCOM G6 SENSOR MISC: 1.0000 | 0 refills | Status: DC

## 2021-07-31 MED ORDER — TRESIBA FLEXTOUCH 200 UNIT/ML ~~LOC~~ SOPN: 30.0000 [IU] | PEN_INJECTOR | Freq: Every day | SUBCUTANEOUS | 0 refills | Status: DC

## 2021-07-31 MED ORDER — INSULIN LISPRO (1 UNIT DIAL) 100 UNIT/ML (KWIKPEN): PEN_INJECTOR | SUBCUTANEOUS | 0 refills | Status: DC

## 2021-07-31 NOTE — Telephone Encounter (Signed)
Message sent to patient that she needs appt before short supply will be sent  ?

## 2021-08-05 ENCOUNTER — Ambulatory Visit: Payer: 59 | Admitting: Endocrinology

## 2021-08-07 ENCOUNTER — Encounter: Payer: Self-pay | Admitting: Endocrinology

## 2021-08-07 ENCOUNTER — Other Ambulatory Visit (HOSPITAL_BASED_OUTPATIENT_CLINIC_OR_DEPARTMENT_OTHER): Payer: Self-pay

## 2021-08-07 ENCOUNTER — Telehealth: Payer: Self-pay

## 2021-08-07 ENCOUNTER — Other Ambulatory Visit (HOSPITAL_COMMUNITY): Payer: Self-pay

## 2021-08-07 ENCOUNTER — Ambulatory Visit: Payer: 59 | Admitting: Endocrinology

## 2021-08-07 VITALS — BP 118/86 | HR 51 | Ht 65.0 in | Wt 287.6 lb

## 2021-08-07 DIAGNOSIS — E119 Type 2 diabetes mellitus without complications: Secondary | ICD-10-CM | POA: Diagnosis not present

## 2021-08-07 DIAGNOSIS — Z794 Long term (current) use of insulin: Secondary | ICD-10-CM | POA: Diagnosis not present

## 2021-08-07 LAB — POCT GLYCOSYLATED HEMOGLOBIN (HGB A1C): Hemoglobin A1C: 9 % — AB (ref 4.0–5.6)

## 2021-08-07 MED ORDER — TRESIBA FLEXTOUCH 200 UNIT/ML ~~LOC~~ SOPN: 50.0000 [IU] | PEN_INJECTOR | Freq: Every day | SUBCUTANEOUS | 1 refills | Status: DC

## 2021-08-07 MED ORDER — DEXCOM G6 TRANSMITTER MISC
1.0000 | Freq: Once | 1 refills | Status: AC
  Filled 2021-08-07: qty 1, 90d supply, fill #0

## 2021-08-07 MED ORDER — DEXCOM G6 SENSOR MISC: 1.0000 | 3 refills | Status: AC

## 2021-08-07 MED ORDER — DEXCOM G6 SENSOR MISC
1.0000 | 3 refills | Status: DC
  Filled 2021-08-07: qty 9, 90d supply, fill #0

## 2021-08-07 NOTE — Patient Instructions (Addendum)
Please stop taking the Humalog, and increase the [place] to 50 units daily.   ?Please continue the same other diabetes medications.   ?I have sent a prescription to your pharmacy, to refill the continuous glucose monitor supplies.   ?check your blood sugar once a day.  vary the time of day when you check, between before the 3 meals, and at bedtime.  also check if you have symptoms of your blood sugar being too high or too low.  please keep a record of the readings and bring it to your next appointment here (or you can bring the meter itself).  You can write it on any piece of paper.  please call us sooner if your blood sugar goes below 70, or if you have a lot of readings over 200.   ?You should have an endocrinology follow-up appointment in 3 months.   ?

## 2021-08-07 NOTE — Telephone Encounter (Signed)
Patient Advocate Encounter ?  ?Received notification from [facility] that prior authorization for [place] is required by his/her insurance [employer]. ?  ?PA submitted on 08/07/21 ? ?Key#: [id number] ? ?Status is pending ?   ?[facility] will continue to follow: ? ?Patient Advocate ?Fax: [phone]  ?

## 2021-08-07 NOTE — Progress Notes (Signed)
? ?Subjective:  ? ? Patient ID: Katie Luna, female    DOB: 11-18-1975, 46 y.o.   MRN: [id number] ? ?HPI ?Pt returns for f/u of diabetes mellitus:  ?DM type: Insulin-requiring type 2 ?Dx'ed: 2004 ?Complications: none ?Therapy: insulin since 2018, Ozempic and 2 oral meds.   ?GDM: 1998 ?DKA: never ?Severe hypoglycemia: once, in 2004.  ?Pancreatitis: several episode of GB pancreatitis (none since cholecystect in 1998).   ?Pancreatic imaging: no result available to [place].  ?Other: she did not tolerate Bydureon (nodules at injections sites); she cannot take pioglitazone, due to edema; she takes multiple daily injections.   ?Interval history: she has run out of continuous glucose monitor supplies.  no cbg record, but states cbg's vary from 156-400.  Pt says she sometimes misses Humalog, but she does not miss [place].   ?Past Medical History:  ?Diagnosis Date  ? Anemia   ? Asthma 01/09/2020  ? Diabetes mellitus without complication ([place])   ? GERD (gastroesophageal reflux disease)   ? Goiter 12/17/2018  ? Polycystic ovary syndrome   ? ? ?Past Surgical History:  ?Procedure Laterality Date  ? CHOLECYSTECTOMY    ? ? ?Social History  ? ?Socioeconomic History  ? Marital status: Married  ?  Spouse name: Not on file  ? Number of children: 1  ? Years of education: LPN  ? Highest education level: Not on file  ?Occupational History  ? Not on file  ?Tobacco Use  ? Smoking status: Every Day  ?  Packs/day: 0.25  ?  Years: 29.00  ?  Pack years: 7.25  ?  Types: Cigarettes  ? Smokeless tobacco: Never  ?Vaping Use  ? Vaping Use: Never used  ?Substance and Sexual Activity  ? Alcohol use: Yes  ?  Comment: very rare infrequent. occaisional glass of wine.  ? Drug use: No  ? Sexual activity: Yes  ?Other Topics Concern  ? Not on file  ?Social History Narrative  ? Not on file  ? ?Social Determinants of Health  ? ?Financial Resource Strain: Not on file  ?Food Insecurity: Not on file  ?Transportation Needs: Not on file  ?Physical Activity: Not on  file  ?Stress: Not on file  ?Social Connections: Not on file  ?Intimate Partner Violence: Not on file  ? ? ?Current Outpatient Medications on File Prior to Visit  ?Medication Sig Dispense Refill  ? Accu-Chek Softclix Lancets lancets Use as instructed to check blood sugar 3 times a day 100 each 0  ? albuterol (PROVENTIL) (2.5 MG/3ML) 0.083% nebulizer solution Take 3 mLs (2.5 mg total) by nebulization every 6 (six) hours as needed for wheezing or shortness of breath. 150 mL 1  ? atorvastatin (LIPITOR) 10 MG tablet 1 tab po Monday, Wednesday and Friday. 36 tablet 3  ? Blood Glucose Monitoring Suppl (ACCU-CHEK GUIDE ME) w/Device KIT 1Use to check blood sugar 3 times per day 1 kit 0  ? budesonide-formoterol (SYMBICORT) 160-4.5 MCG/ACT inhaler Inhale 2 puffs into the lungs 2 (two) times daily. 1 Inhaler 3  ? DAYSEE 0.15-0.03 &0.01 MG tablet TAKE 1 TABLET BY MOUTH DAILY. 91 tablet 4  ? doxycycline (VIBRA-TABS) 100 MG tablet Take 1 tablet (100 mg total) by mouth 2 (two) times daily. 20 tablet 0  ? empagliflozin (JARDIANCE) 10 MG TABS tablet Take 10 mg by mouth daily. 90 tablet 3  ? fluticasone (FLONASE) 50 MCG/ACT nasal spray Place 2 sprays into both nostrils daily. 16 g 1  ? glucose blood (ACCU-CHEK GUIDE) test strip  Use as instructed to check blood sugar 3 times per day 100 each 0  ? hydrocortisone 2.5 % ointment Apply on to the skin 2 (two) times daily. 28.35 g 0  ? Insulin Pen Needle (PEN NEEDLES) 32G X 5 MM MISC Use as instructed 100 each 1  ? ipratropium-albuterol (DUONEB) 0.5-2.5 (3) MG/3ML SOLN Take 3 mLs by nebulization every 4 (four) hours as needed. 360 mL 0  ? ketoconazole (NIZORAL) 2 % shampoo Apply 1 application topically 2 (two) times a week. 120 mL 1  ? metFORMIN (GLUCOPHAGE) 500 MG tablet Take 1 tablet (500 mg total) by mouth 2 (two) times daily with a meal. 180 tablet 3  ? pantoprazole (PROTONIX) 40 MG tablet Take 1 tablet (40 mg total) by mouth daily. 30 tablet 3  ? phentermine 37.5 MG capsule Take 1  capsule (37.5 mg total) by mouth every morning. 30 capsule 0  ? Semaglutide, 2 MG/DOSE, (OZEMPIC, 2 MG/DOSE,) 8 MG/3ML SOPN Inject 2 mg into the skin once a week. 6 mL 0  ? Spacer/Aero-Holding Chambers (AEROCHAMBER MV) inhaler Use as instructed 1 each 2  ? varenicline (CHANTIX STARTING MONTH PAK) 0.5 MG X 11 & 1 MG X 42 tablet Take one 0.5 mg tablet by mouth once daily for 3 days, then increase to one 0.5 mg tablet twice daily for 4 days, then increase to one 1 mg tablet twice daily. 53 tablet 0  ? VENTOLIN HFA 108 (90 Base) MCG/ACT inhaler Inhale 2 puffs into the lungs every 6 (six) hours as needed for wheezing or shortness of breath. 18 g 0  ? buPROPion (WELLBUTRIN XL) 150 MG 24 hr tablet TAKE 1 TABLET (150 MG TOTAL) BY MOUTH DAILY. 30 tablet 11  ? gabapentin (NEURONTIN) 100 MG capsule TAKE 1 CAPSULE (100 MG TOTAL) BY MOUTH 2 (TWO) TIMES DAILY. 90 capsule 0  ? lamoTRIgine (LAMICTAL) 200 MG tablet TAKE 1 TABLET (200 MG TOTAL) BY MOUTH DAILY. 90 tablet 3  ? levocetirizine (XYZAL) 5 MG tablet TAKE 1 TABLET BY MOUTH EVERY EVENING 30 tablet 6  ? montelukast (SINGULAIR) 10 MG tablet TAKE 1 TABLET BY MOUTH EVERY NIGHT AT BEDTIME 30 tablet 6  ? [DISCONTINUED] insulin glargine (LANTUS) 100 UNIT/ML injection Inject 0.1 mLs (10 Units total) into the skin daily. 10 mL 11  ? ?No current facility-administered medications on file prior to visit.  ? ? ?Allergies  ?Allergen Reactions  ? Other Anaphylaxis  ? Shellfish Allergy Swelling  ? ? ?Family History  ?Problem Relation Age of Onset  ? Diabetes Mother   ? Cancer Mother   ? Sickle cell anemia Father   ? Alcohol abuse Father   ? Colon cancer Father   ? Diabetes Maternal Grandfather   ? ? ?BP 118/86 (BP Location: Left Arm, Patient Position: Sitting, Cuff Size: Normal)   Pulse (!) 51   Ht _0  (1.651 m)   Wt 287 lb 9.6 oz (130.5 kg)   SpO2 99%   BMI 47.86 kg/m?  ? ? ?Review of Systems ?She denies hypoglycemia ?   ?Objective:  ? Physical Exam ?VITAL SIGNS:  See vs  page. ?GENERAL: no distress.   ? ? ? ?Lab Results  ?Component Value Date  ? HGBA1C 11.8 (A) 07/27/2020  ? ?   ?Assessment & Plan:  ?Insulin-requiring type 2 DM: uncontrolled.   ?Noncompliance with cbg recording and insulin.  We'll change to QD insulin.   ? ?Patient Instructions  ?Please stop taking the Humalog, and increase the [place]  to 50 units daily.   ?Please continue the same other diabetes medications.   ?I have sent a prescription to your pharmacy, to refill the continuous glucose monitor supplies.   ?check your blood sugar once a day.  vary the time of day when you check, between before the 3 meals, and at bedtime.  also check if you have symptoms of your blood sugar being too high or too low.  please keep a record of the readings and bring it to your next appointment here (or you can bring the meter itself).  You can write it on any piece of paper.  please call us sooner if your blood sugar goes below 70, or if you have a lot of readings over 200.   ?You should have an endocrinology follow-up appointment in 3 months.   ? ? ?

## 2021-08-08 ENCOUNTER — Other Ambulatory Visit (HOSPITAL_BASED_OUTPATIENT_CLINIC_OR_DEPARTMENT_OTHER): Payer: Self-pay

## 2021-08-09 ENCOUNTER — Other Ambulatory Visit (HOSPITAL_COMMUNITY): Payer: Self-pay

## 2021-08-09 NOTE — Telephone Encounter (Signed)
Received notification from [facility] regarding a prior authorization for DEXCOM G6 SENSOR/TRANSMITTER. Authorization has been APPROVED from 04.20.23 to 04.19.2024.  ? ?Per test claim FOR SENSORS, copay for 30 days supply is $66.18 ? ?SENSOR Authorization # PA Case ID: [id number] ? ?Per test claim FOR TRANSMITTER, copay for 90 days supply is $90 ? ?TRANSMITTER Authorization # PA Case ID: [id number] ?

## 2021-10-02 ENCOUNTER — Other Ambulatory Visit (HOSPITAL_COMMUNITY): Payer: Self-pay

## 2021-10-03 ENCOUNTER — Telehealth: Payer: Self-pay | Admitting: Pharmacy Technician

## 2021-10-03 NOTE — Telephone Encounter (Signed)
Received notification from [facility] regarding a prior authorization for  Ozempic 2mg  dose . Authorization has been APPROVED from 10/02/21 to 10/02/22.   Authorization # 10/04/22 Phone # [phone]

## 2021-10-04 ENCOUNTER — Ambulatory Visit: Payer: 59 | Admitting: Medical

## 2021-10-29 ENCOUNTER — Telehealth: Payer: Self-pay

## 2021-10-29 NOTE — Telephone Encounter (Signed)
error 

## 2021-10-29 NOTE — Telephone Encounter (Signed)
Attempted to contact the patient in regards to rescheduling with another provider, LVM for a call back. 

## 2021-11-19 ENCOUNTER — Other Ambulatory Visit: Payer: Self-pay

## 2021-11-19 DIAGNOSIS — Z794 Long term (current) use of insulin: Secondary | ICD-10-CM

## 2021-11-19 MED ORDER — DEXCOM G6 TRANSMITTER MISC: 2 refills | Status: AC

## 2021-11-27 ENCOUNTER — Encounter (INDEPENDENT_AMBULATORY_CARE_PROVIDER_SITE_OTHER): Payer: Self-pay

## 2022-09-18 ENCOUNTER — Other Ambulatory Visit (HOSPITAL_COMMUNITY): Payer: Self-pay

## 2022-09-18 ENCOUNTER — Encounter: Payer: Self-pay | Admitting: "Endocrinology

## 2022-09-18 ENCOUNTER — Ambulatory Visit: Payer: 59 | Admitting: "Endocrinology

## 2022-09-18 ENCOUNTER — Telehealth: Payer: Self-pay

## 2022-09-18 VITALS — BP 122/70 | HR 93 | Ht 65.0 in | Wt 279.0 lb

## 2022-09-18 DIAGNOSIS — E7800 Pure hypercholesterolemia, unspecified: Secondary | ICD-10-CM | POA: Diagnosis not present

## 2022-09-18 DIAGNOSIS — E1165 Type 2 diabetes mellitus with hyperglycemia: Secondary | ICD-10-CM

## 2022-09-18 DIAGNOSIS — Z794 Long term (current) use of insulin: Secondary | ICD-10-CM | POA: Diagnosis not present

## 2022-09-18 LAB — LIPID PANEL
Cholesterol: 130 mg/dL (ref 0–200)
HDL: 28.6 mg/dL — ABNORMAL LOW (ref >39.00)
LDL Cholesterol: 77 mg/dL (ref 0–99)
NonHDL: 101.2
Total CHOL/HDL Ratio: 5
Triglycerides: 122 mg/dL (ref 0.0–149.0)
VLDL: 24.4 mg/dL (ref 0.0–40.0)

## 2022-09-18 LAB — COMPREHENSIVE METABOLIC PANEL
ALT: 17 U/L (ref 0–35)
AST: 16 U/L (ref 0–37)
Albumin: 3.8 g/dL (ref 3.5–5.2)
Alkaline Phosphatase: 101 U/L (ref 39–117)
BUN: 14 mg/dL (ref 6–23)
CO2: 25 mEq/L (ref 19–32)
Calcium: 8.9 mg/dL (ref 8.4–10.5)
Chloride: 101 mEq/L (ref 96–112)
Creatinine, Ser: 0.73 mg/dL (ref 0.40–1.20)
GFR: 98.24 mL/min (ref >60.00)
Glucose, Bld: 289 mg/dL — ABNORMAL HIGH (ref 70–99)
Potassium: 3.9 mEq/L (ref 3.5–5.1)
Sodium: 133 mEq/L — ABNORMAL LOW (ref 135–145)
Total Bilirubin: 0.5 mg/dL (ref 0.2–1.2)
Total Protein: 7.7 g/dL (ref 6.0–8.3)

## 2022-09-18 LAB — POCT GLYCOSYLATED HEMOGLOBIN (HGB A1C): Hemoglobin A1C: 10.5 % — AB (ref 4.0–5.6)

## 2022-09-18 MED ORDER — OZEMPIC (2 MG/DOSE) 8 MG/3ML ~~LOC~~ SOPN: 2.0000 mg | PEN_INJECTOR | SUBCUTANEOUS | 0 refills | Status: DC

## 2022-09-18 MED ORDER — DEXCOM G7 SENSOR MISC: 1.0000 | 0 refills | Status: AC

## 2022-09-18 MED ORDER — DEXCOM G7 RECEIVER DEVI: 1.0000 | 0 refills | Status: AC

## 2022-09-18 MED ORDER — TRESIBA FLEXTOUCH 200 UNIT/ML ~~LOC~~ SOPN: 50.0000 [IU] | PEN_INJECTOR | Freq: Every day | SUBCUTANEOUS | 1 refills | Status: DC

## 2022-09-18 NOTE — Telephone Encounter (Signed)
Per test claims: All of these will need a PA.   Katie Luna: Drug not covered. Insurance would prefer Lantus or Toujeo.   Please advise if a change is appropriate, or if a PA will be needed for [place]

## 2022-09-18 NOTE — Progress Notes (Signed)
Outpatient Endocrinology Note Katie Mentone, MD  09/18/22   Katie Luna Oct 07, 1975 [id number]  Referring Provider: Marisue Brooklyn Primary Care Provider: Esperanza Richters, PA-C Reason for consultation: Subjective   Assessment & Plan  Diagnoses and all orders for this visit:  Uncontrolled type 2 diabetes mellitus with hyperglycemia (HCC) -     POCT glycosylated hemoglobin (Hb A1C) -     Comprehensive metabolic panel; Future -     Lipid panel; Future -     Microalbumin / creatinine urine ratio; Future -     Microalbumin / creatinine urine ratio -     Comprehensive metabolic panel -     Lipid panel  Long-term insulin use (HCC)  Pure hypercholesterolemia  Other orders -     Semaglutide, 2 MG/DOSE, (OZEMPIC, 2 MG/DOSE,) 8 MG/3ML SOPN; Inject 2 mg into the skin once a week. -     insulin degludec (TRESIBA FLEXTOUCH) 200 UNIT/ML FlexTouch Pen; Inject 50 Units into the skin daily. -     Continuous Glucose Sensor (DEXCOM G7 SENSOR) MISC; 1 Device by Does not apply route continuous. -     Continuous Glucose Receiver (DEXCOM G7 RECEIVER) DEVI; 1 Device by Does not apply route continuous.   Diabetes complicated by neuropathy  Hba1c goal less than 7.0, current Hba1c is 10.5. Will recommend for the following change of medications to:  Tresiba 50 units once daily Resume Humalog 6 units tidac + sliding scale 2:50>150 - 15 min before meals Ozempic 2 mg weekly  Ordered DexCom G7  No known contraindications to any of above medications  Hyperlipidemia -Last LDL off goal: 93 -on atorvastatin 10 mg QD -Follow low fat diet and exercise  -Ordered lab   -Blood pressure goal <140/90 - Microalbumin/creatinine goal < 30 -not on ACE/ARB  -diet changes including salt restriction -limit eating outside -counseled BP targets per standards of diabetes care -Uncontrolled blood pressure can lead to retinopathy, nephropathy and cardiovascular and atherosclerotic heart  disease  Reviewed and counseled on: -A1C target -Blood sugar targets -Complications of uncontrolled diabetes  -Checking blood sugar before meals and bedtime and bring log next visit -All medications with mechanism of action and side effects -Hypoglycemia management: rule of 15's, Glucagon Emergency Kit and medical alert ID -low-carb low-fat plate-method diet -At least 20 minutes of physical activity per day -Annual dilated retinal eye exam and foot exam -compliance and follow up needs -follow up as scheduled or earlier if problem gets worse  Call if blood sugar is less than 70 or consistently above 250    Take a 15 gm snack of carbohydrate at bedtime before you go to sleep if your blood sugar is less than 100.    If you are going to fast after midnight for a test or procedure, ask your physician for instructions on how to reduce/decrease your insulin dose.    Call if blood sugar is less than 70 or consistently above 250  -Treating a low sugar by rule of 15 (15 gms of sugar every 15 min until sugar is more than 70) If you feel your sugar is low, test your sugar to be sure If your sugar is low (less than 70), then take 15 grams of a fast acting Carbohydrate (3-4 glucose tablets or glucose gel or 4 ounces of juice or regular soda) Recheck your sugar 15 min after treating low to make sure it is more than 70 If sugar is still less than 70, treat again with 15 grams  of carbohydrate          Don't drive the hour of hypoglycemia  If unconscious/unable to eat or drink by mouth, use glucagon injection or nasal spray baqsimi and call 911. Can repeat again in 15 min if still unconscious.  Return in about 2 months (around 11/18/2022).   I have reviewed current medications, nurse's notes, allergies, vital signs, past medical and surgical history, family medical history, and social history for this encounter. Counseled patient on symptoms, examination findings, lab findings, imaging results,  treatment decisions and monitoring and prognosis. The patient understood the recommendations and agrees with the treatment plan. All questions regarding treatment plan were fully answered.  Katie [place], MD  09/18/22    History of Present Illness Katie Luna is a 47 y.o. year old female who presents for evaluation of Type 2 diabetes mellitus.  Katie Luna was first diagnosed in 2004.   Diabetes education +  Home diabetes regimen: Humalog takes 2 times a day after meal, 6 units + sliding scale 2:50>150  Ran out of Tresiba 50 units daily and ozempic 2 mg weekly  Had no issues with above, no hypoglycemia   Taken off of jardiance and metformin   Therapy: insulin since 2018, Ozempic and 2 oral meds.   GDM: 1998 DKA: never Severe hypoglycemia: once, in 2004.  Pancreatitis: several episode of GB pancreatitis (none since cholecystect in 1998).   Pancreatic imaging: no result available to [place].  Other: she did not tolerate Bydureon (nodules at injections sites); she cannot take pioglitazone, due to edema  COMPLICATIONS -  MI/Stroke -  retinopathy +  neuropathy -  nephropathy  BLOOD SUGAR DATA Doesn't check BG No meter brought  Physical Exam  BP 122/70   Pulse 93   Ht 5\' 5"  (1.651 m)   Wt 279 lb (126.6 kg)   SpO2 95%   BMI 46.43 kg/m    Constitutional: well developed, well nourished Head: normocephalic, atraumatic Eyes: sclera anicteric, no redness Neck: supple Lungs: normal respiratory effort Neurology: alert and oriented Skin: dry, no appreciable rashes Musculoskeletal: no appreciable defects Psychiatric: normal mood and affect Diabetic Foot Exam - Simple   Simple Foot Form Diabetic Foot exam was performed with the following findings: Yes 09/18/2022  9:06 AM  Visual Inspection No deformities, no ulcerations, no other skin breakdown bilaterally: Yes Sensation Testing Intact to touch and monofilament testing bilaterally: Yes Pulse Check Posterior  Tibialis and Dorsalis pulse intact bilaterally: Yes Comments      Current Medications Patient's Medications  New Prescriptions   CONTINUOUS GLUCOSE RECEIVER (DEXCOM G7 RECEIVER) DEVI    1 Device by Does not apply route continuous.   CONTINUOUS GLUCOSE SENSOR (DEXCOM G7 SENSOR) MISC    1 Device by Does not apply route continuous.  Previous Medications   ACCU-CHEK SOFTCLIX LANCETS LANCETS    Use as instructed to check blood sugar 3 times a day   ALBUTEROL (PROVENTIL) (2.5 MG/3ML) 0.083% NEBULIZER SOLUTION    Take 3 mLs (2.5 mg total) by nebulization every 6 (six) hours as needed for wheezing or shortness of breath.   ATORVASTATIN (LIPITOR) 10 MG TABLET    1 tab po Monday, Wednesday and Friday.   BLOOD GLUCOSE MONITORING SUPPL (ACCU-CHEK GUIDE ME) W/DEVICE KIT    1Use to check blood sugar 3 times per day   BUDESONIDE-FORMOTEROL (SYMBICORT) 160-4.5 MCG/ACT INHALER    Inhale 2 puffs into the lungs 2 (two) times daily.   BUPROPION (WELLBUTRIN XL) 150 MG 24  HR TABLET    TAKE 1 TABLET (150 MG TOTAL) BY MOUTH DAILY.   CONTINUOUS BLOOD GLUC SENSOR (DEXCOM G6 SENSOR) MISC    1 Device by Does not apply route See admin instructions. Change every 10 days   CONTINUOUS BLOOD GLUC TRANSMIT (DEXCOM G6 TRANSMITTER) MISC    Change every 3 months   DAYSEE 0.15-0.03 &0.01 MG TABLET    TAKE 1 TABLET BY MOUTH DAILY.   DOXYCYCLINE (VIBRA-TABS) 100 MG TABLET    Take 1 tablet (100 mg total) by mouth 2 (two) times daily.   EMPAGLIFLOZIN (JARDIANCE) 10 MG TABS TABLET    Take 10 mg by mouth daily.   FLUTICASONE (FLONASE) 50 MCG/ACT NASAL SPRAY    Place 2 sprays into both nostrils daily.   GABAPENTIN (NEURONTIN) 100 MG CAPSULE    TAKE 1 CAPSULE (100 MG TOTAL) BY MOUTH 2 (TWO) TIMES DAILY.   GLUCOSE BLOOD (ACCU-CHEK GUIDE) TEST STRIP    Use as instructed to check blood sugar 3 times per day   HYDROCORTISONE 2.5 % OINTMENT    Apply on to the skin 2 (two) times daily.   INSULIN LISPRO (HUMALOG) 100 UNIT/ML KWIKPEN    INJECT  5 UNITS BEFORE MEALS 3 TIMES DAILY - OR HIGHER PER SLIDING SCALE. MAXIMUM OF 45 UNITS PER DAY   INSULIN PEN NEEDLE (PEN NEEDLES) 32G X 5 MM MISC    Use as instructed   IPRATROPIUM-ALBUTEROL (DUONEB) 0.5-2.5 (3) MG/3ML SOLN    Take 3 mLs by nebulization every 4 (four) hours as needed.   KETOCONAZOLE (NIZORAL) 2 % SHAMPOO    Apply 1 application topically 2 (two) times a week.   LAMOTRIGINE (LAMICTAL) 200 MG TABLET    TAKE 1 TABLET (200 MG TOTAL) BY MOUTH DAILY.   LEVOCETIRIZINE (XYZAL) 5 MG TABLET    TAKE 1 TABLET BY MOUTH EVERY EVENING   METFORMIN (GLUCOPHAGE) 500 MG TABLET    Take 1 tablet (500 mg total) by mouth 2 (two) times daily with a meal.   MONTELUKAST (SINGULAIR) 10 MG TABLET    TAKE 1 TABLET BY MOUTH EVERY NIGHT AT BEDTIME   PANTOPRAZOLE (PROTONIX) 40 MG TABLET    Take 1 tablet (40 mg total) by mouth daily.   PHENTERMINE 37.5 MG CAPSULE    Take 1 capsule (37.5 mg total) by mouth every morning.   SPACER/AERO-HOLDING CHAMBERS (AEROCHAMBER MV) INHALER    Use as instructed   VARENICLINE (CHANTIX STARTING MONTH PAK) 0.5 MG X 11 & 1 MG X 42 TABLET    Take one 0.5 mg tablet by mouth once daily for 3 days, then increase to one 0.5 mg tablet twice daily for 4 days, then increase to one 1 mg tablet twice daily.   VENTOLIN HFA 108 (90 BASE) MCG/ACT INHALER    Inhale 2 puffs into the lungs every 6 (six) hours as needed for wheezing or shortness of breath.  Modified Medications   Modified Medication Previous Medication   INSULIN DEGLUDEC (TRESIBA FLEXTOUCH) 200 UNIT/ML FLEXTOUCH PEN insulin degludec (TRESIBA FLEXTOUCH) 200 UNIT/ML FlexTouch Pen      Inject 50 Units into the skin daily.    Inject 50 Units into the skin daily.   SEMAGLUTIDE, 2 MG/DOSE, (OZEMPIC, 2 MG/DOSE,) 8 MG/3ML SOPN Semaglutide, 2 MG/DOSE, (OZEMPIC, 2 MG/DOSE,) 8 MG/3ML SOPN      Inject 2 mg into the skin once a week.    Inject 2 mg into the skin once a week.  Discontinued Medications   No medications  on file     Allergies Allergies  Allergen Reactions   Other Anaphylaxis   Shellfish Allergy Swelling    Past Medical History Past Medical History:  Diagnosis Date   Anemia    Asthma 01/09/2020   Diabetes mellitus without complication (HCC)    GERD (gastroesophageal reflux disease)    Goiter 12/17/2018   Polycystic ovary syndrome     Past Surgical History Past Surgical History:  Procedure Laterality Date   CHOLECYSTECTOMY      Family History family history includes Alcohol abuse in her father; Cancer in her mother; Colon cancer in her father; Diabetes in her maternal grandfather and mother; Sickle cell anemia in her father.  Social History Social History   Socioeconomic History   Marital status: Married    Spouse name: Not on file   Number of children: 1   Years of education: LPN   Highest education level: Not on file  Occupational History   Not on file  Tobacco Use   Smoking status: Every Day    Packs/day: 0.25    Years: 29.00    Additional pack years: 0.00    Total pack years: 7.25    Types: Cigarettes   Smokeless tobacco: Never  Vaping Use   Vaping Use: Never used  Substance and Sexual Activity   Alcohol use: Yes    Comment: very rare infrequent. occaisional glass of wine.   Drug use: No   Sexual activity: Yes  Other Topics Concern   Not on file  Social History Narrative   Not on file   Social Determinants of Health   Financial Resource Strain: Not on file  Food Insecurity: Not on file  Transportation Needs: Not on file  Physical Activity: Sufficiently Active (05/08/2017)   Exercise Vital Sign    Days of Exercise per Week: 3 days    Minutes of Exercise per Session: 60 min  Stress: Stress Concern Present (05/08/2017)   [employer] of Occupational Health - Occupational Stress Questionnaire    Feeling of Stress : Very much  Social Connections: Not on file  Intimate Partner Violence: Not on file    Lab Results  Component Value Date   HGBA1C 10.5  (A) 09/18/2022   HGBA1C 9.0 (A) 08/07/2021   HGBA1C 11.8 (A) 07/27/2020   Lab Results  Component Value Date   CHOL 142 12/09/2019   Lab Results  Component Value Date   HDL 30.90 (L) 12/09/2019   Lab Results  Component Value Date   LDLCALC 93 12/09/2019   Lab Results  Component Value Date   TRIG 90.0 12/09/2019   Lab Results  Component Value Date   CHOLHDL 5 12/09/2019   Lab Results  Component Value Date   CREATININE 0.66 12/09/2019   Lab Results  Component Value Date   GFR 117.61 12/09/2019   No results found for: "MICROALBUR", "[id number]"    Component Value Date/Time   NA 137 12/09/2019 0732   K 4.2 12/09/2019 0732   CL 103 12/09/2019 0732   CO2 28 12/09/2019 0732   GLUCOSE 220 (H) 12/09/2019 0732   BUN 6 12/09/2019 0732   CREATININE 0.66 12/09/2019 0732   CALCIUM 8.6 12/09/2019 0732   PROT 7.1 12/09/2019 0732   ALBUMIN 3.6 12/09/2019 0732   AST 10 12/09/2019 0732   ALT 13 12/09/2019 0732   ALKPHOS 88 12/09/2019 0732   BILITOT 0.6 12/09/2019 0732      Latest Ref Rng & Units 12/09/2019    7:32 AM  12/17/2018    7:57 AM 02/19/2018    9:53 AM  BMP  Glucose 70 - 99 mg/dL 161  096  045   BUN 6 - 23 mg/dL 6  10  12    Creatinine 0.40 - 1.20 mg/dL 4.09  8.11  9.14   Sodium 135 - 145 mEq/L 137  137  139   Potassium 3.5 - 5.1 mEq/L 4.2  4.0  4.4   Chloride 96 - 112 mEq/L 103  104  105   CO2 19 - 32 mEq/L 28  25  27    Calcium 8.4 - 10.5 mg/dL 8.6  8.6  9.2        Component Value Date/Time   WBC 5.3 12/09/2019 0732   RBC 4.17 12/09/2019 0732   HGB 13.5 12/09/2019 0732   HGB 11.0 (L) 10/29/2017 1530   HCT 40.4 12/09/2019 0732   HCT 34.4 10/29/2017 1530   PLT 196.0 12/09/2019 0732   MCV 97.1 12/09/2019 0732   MCV 83 10/29/2017 1530   MCH 26.4 (L) 10/29/2017 1530   MCHC 33.4 12/09/2019 0732   RDW 13.2 12/09/2019 0732   RDW 16.3 (H) 10/29/2017 1530   LYMPHSABS 2.3 12/09/2019 0732   LYMPHSABS 3.2 (H) 10/29/2017 1530   MONOABS 0.4 12/09/2019 0732   EOSABS  0.1 12/09/2019 0732   EOSABS 0.2 10/29/2017 1530   BASOSABS 0.0 12/09/2019 0732   BASOSABS 0.1 10/29/2017 1530     Parts of this note may have been dictated using voice recognition software. There may be variances in spelling and vocabulary which are unintentional. Not all errors are proofread. Please notify the Thereasa Parkin if any discrepancies are noted or if the meaning of any statement is not clear.

## 2022-09-18 NOTE — Telephone Encounter (Signed)
Can someone help with seeing if these prescriptions are covered for this patient.   Semaglutide, 2 MG/DOSE, (OZEMPIC, 2 MG/DOSE,) 8 MG/3ML SOPN; Inject 2 mg into the skin once a week. -     insulin degludec (TRESIBA FLEXTOUCH) 200 UNIT/ML FlexTouch Pen; Inject 50 Units into the skin daily. -     Continuous Glucose Sensor (DEXCOM G7 SENSOR) MISC; 1 Device by Does not apply route continuous. -     Continuous Glucose Receiver (DEXCOM G7 RECEIVER) DEVI; 1 Device by Does not apply route continuous.

## 2022-09-18 NOTE — Patient Instructions (Signed)

## 2022-09-18 NOTE — Telephone Encounter (Signed)
Please see message from PA team.

## 2022-09-19 ENCOUNTER — Other Ambulatory Visit: Payer: 59

## 2022-09-19 ENCOUNTER — Encounter: Payer: Self-pay | Admitting: "Endocrinology

## 2022-09-19 DIAGNOSIS — E1165 Type 2 diabetes mellitus with hyperglycemia: Secondary | ICD-10-CM

## 2022-09-19 LAB — MICROALBUMIN / CREATININE URINE RATIO
Creatinine,U: 120 mg/dL
Microalb Creat Ratio: 0.6 mg/g (ref 0.0–30.0)
Microalb, Ur: 0.7 mg/dL (ref 0.0–1.9)

## 2022-09-19 MED ORDER — INSULIN GLARGINE 100 UNIT/ML SOLOSTAR PEN: 50.0000 [IU] | PEN_INJECTOR | Freq: Every day | SUBCUTANEOUS | 0 refills | Status: AC

## 2022-09-22 ENCOUNTER — Telehealth: Payer: Self-pay

## 2022-09-22 MED ORDER — INSULIN LISPRO (1 UNIT DIAL) 100 UNIT/ML (KWIKPEN): 6.0000 [IU] | PEN_INJECTOR | Freq: Three times a day (TID) | SUBCUTANEOUS | 2 refills | Status: AC

## 2022-09-22 NOTE — Telephone Encounter (Signed)
Please start PA for Dexcom G7 and Ozempic

## 2022-09-22 NOTE — Telephone Encounter (Signed)
Patient sent a message via MyChart, she was requesting a refill on Humalog. Are you still wanting her to use this with the same sig from 2019? Also her last refill was in 2019

## 2022-09-23 ENCOUNTER — Telehealth: Payer: Self-pay

## 2022-09-23 ENCOUNTER — Other Ambulatory Visit (HOSPITAL_COMMUNITY): Payer: Self-pay

## 2022-09-23 NOTE — Telephone Encounter (Signed)
Patient Advocate Encounter   Received notification from pt msgs that prior authorization is required for [facility] G7 receiver  Submitted: 09/23/22 Key [id number]  Status is pending

## 2022-09-23 NOTE — Telephone Encounter (Signed)
Patient Advocate Encounter   Received notification from pt msgs that prior authorization is required for Ozempic  Submitted: 09/23/22 Key [id number]  Status is pending

## 2022-09-23 NOTE — Telephone Encounter (Signed)
Patient Advocate Encounter   Received notification from pt msgs that prior authorization is required for Dexcom G7 sensor  Submitted: 09/23/22 Key [id number]  Status is pending

## 2022-09-25 NOTE — Telephone Encounter (Signed)
Pharmacy Patient Advocate Encounter  Prior Authorization has been APPROVED  Effective through 09/23/2023  

## 2022-09-30 NOTE — Telephone Encounter (Signed)
Pt has been notified.

## 2022-09-30 NOTE — Telephone Encounter (Signed)
Pt has been notified and voices understanding.  

## 2022-11-18 ENCOUNTER — Ambulatory Visit: Payer: 59 | Admitting: "Endocrinology

## 2023-01-14 ENCOUNTER — Other Ambulatory Visit: Payer: Self-pay | Admitting: "Endocrinology

## 2023-04-18 ENCOUNTER — Encounter: Payer: Self-pay | Admitting: Medical
